# Patient Record
Sex: Female | Born: 1950 | Race: White | Hispanic: No | Marital: Married | Smoking: Never smoker
Health system: Southern US, Community
[De-identification: ages and names within clinical notes are randomized; demographics above are authoritative.]

## PROBLEM LIST (undated history)

## (undated) DIAGNOSIS — M316 Other giant cell arteritis: Secondary | ICD-10-CM

## (undated) DIAGNOSIS — K219 Gastro-esophageal reflux disease without esophagitis: Secondary | ICD-10-CM

## (undated) DIAGNOSIS — M353 Polymyalgia rheumatica: Secondary | ICD-10-CM

## (undated) DIAGNOSIS — I7300 Raynaud's syndrome without gangrene: Secondary | ICD-10-CM

## (undated) DIAGNOSIS — IMO0001 Reserved for inherently not codable concepts without codable children: Secondary | ICD-10-CM

## (undated) HISTORY — DX: Gastro-esophageal reflux disease without esophagitis: K21.9

## (undated) HISTORY — DX: Polymyalgia rheumatica: M35.3

## (undated) HISTORY — PX: TEMPORAL ARTERY BIOPSY / LIGATION: SUR132

## (undated) HISTORY — PX: LASIK: SHX215

## (undated) HISTORY — DX: Raynaud's syndrome without gangrene: I73.00

## (undated) HISTORY — DX: Other giant cell arteritis: M31.6

## (undated) HISTORY — DX: Reserved for inherently not codable concepts without codable children: IMO0001

---

## 2007-10-31 ENCOUNTER — Ambulatory Visit: Payer: Self-pay | Admitting: Internal Medicine

## 2008-09-27 ENCOUNTER — Ambulatory Visit: Payer: Self-pay | Admitting: Family Medicine

## 2010-02-22 ENCOUNTER — Ambulatory Visit: Payer: Self-pay

## 2012-01-31 ENCOUNTER — Ambulatory Visit: Payer: Self-pay

## 2012-04-04 DIAGNOSIS — I7300 Raynaud's syndrome without gangrene: Secondary | ICD-10-CM | POA: Insufficient documentation

## 2012-04-04 DIAGNOSIS — N951 Menopausal and female climacteric states: Secondary | ICD-10-CM | POA: Insufficient documentation

## 2013-05-17 DIAGNOSIS — M316 Other giant cell arteritis: Secondary | ICD-10-CM | POA: Insufficient documentation

## 2013-07-03 ENCOUNTER — Ambulatory Visit: Payer: Self-pay | Admitting: Hematology and Oncology

## 2013-07-29 ENCOUNTER — Ambulatory Visit: Payer: Self-pay | Admitting: Hematology and Oncology

## 2013-07-29 LAB — CBC CANCER CENTER
BASOS PCT: 0.6 %
Basophil #: 0.1 x10 3/mm (ref 0.0–0.1)
EOS ABS: 0.1 x10 3/mm (ref 0.0–0.7)
Eosinophil %: 0.9 %
HCT: 42.3 % (ref 35.0–47.0)
HGB: 13.7 g/dL (ref 12.0–16.0)
LYMPHS ABS: 1.1 x10 3/mm (ref 1.0–3.6)
Lymphocyte %: 9.5 %
MCH: 29.3 pg (ref 26.0–34.0)
MCHC: 32.5 g/dL (ref 32.0–36.0)
MCV: 90 fL (ref 80–100)
MONO ABS: 0.7 x10 3/mm (ref 0.2–0.9)
Monocyte %: 6.1 %
Neutrophil #: 9.8 x10 3/mm — ABNORMAL HIGH (ref 1.4–6.5)
Neutrophil %: 82.9 %
Platelet: 260 x10 3/mm (ref 150–440)
RBC: 4.69 10*6/uL (ref 3.80–5.20)
RDW: 14.6 % — ABNORMAL HIGH (ref 11.5–14.5)
WBC: 11.9 x10 3/mm — ABNORMAL HIGH (ref 3.6–11.0)

## 2013-08-11 ENCOUNTER — Ambulatory Visit: Payer: Self-pay | Admitting: Hematology and Oncology

## 2013-09-08 ENCOUNTER — Ambulatory Visit: Payer: Self-pay | Admitting: Hematology and Oncology

## 2013-10-09 ENCOUNTER — Ambulatory Visit: Payer: Self-pay | Admitting: Hematology and Oncology

## 2013-11-08 ENCOUNTER — Ambulatory Visit: Payer: Self-pay | Admitting: Hematology and Oncology

## 2013-11-25 ENCOUNTER — Ambulatory Visit: Payer: Self-pay | Admitting: Hematology and Oncology

## 2013-12-09 ENCOUNTER — Ambulatory Visit: Payer: Self-pay | Admitting: Hematology and Oncology

## 2014-01-08 ENCOUNTER — Ambulatory Visit: Payer: Self-pay | Admitting: Internal Medicine

## 2014-01-08 ENCOUNTER — Ambulatory Visit: Payer: Self-pay | Admitting: Hematology and Oncology

## 2014-02-08 ENCOUNTER — Ambulatory Visit: Payer: Self-pay | Admitting: Hematology and Oncology

## 2014-03-05 ENCOUNTER — Ambulatory Visit: Payer: Self-pay

## 2014-03-11 ENCOUNTER — Ambulatory Visit: Payer: Self-pay | Admitting: Hematology and Oncology

## 2014-03-11 DIAGNOSIS — Z79899 Other long term (current) drug therapy: Secondary | ICD-10-CM | POA: Diagnosis not present

## 2014-03-11 DIAGNOSIS — M316 Other giant cell arteritis: Secondary | ICD-10-CM | POA: Diagnosis not present

## 2014-04-10 ENCOUNTER — Ambulatory Visit: Payer: Self-pay | Admitting: Hematology and Oncology

## 2014-05-06 DIAGNOSIS — Z79899 Other long term (current) drug therapy: Secondary | ICD-10-CM | POA: Diagnosis not present

## 2014-05-06 DIAGNOSIS — M316 Other giant cell arteritis: Secondary | ICD-10-CM | POA: Diagnosis not present

## 2014-05-26 ENCOUNTER — Ambulatory Visit: Payer: Self-pay | Admitting: Hematology and Oncology

## 2014-05-26 DIAGNOSIS — M316 Other giant cell arteritis: Secondary | ICD-10-CM | POA: Diagnosis not present

## 2014-05-26 DIAGNOSIS — Z79899 Other long term (current) drug therapy: Secondary | ICD-10-CM | POA: Diagnosis not present

## 2014-06-10 ENCOUNTER — Ambulatory Visit: Payer: Self-pay | Admitting: Hematology and Oncology

## 2014-06-10 DIAGNOSIS — Z79899 Other long term (current) drug therapy: Secondary | ICD-10-CM | POA: Diagnosis not present

## 2014-06-10 DIAGNOSIS — M316 Other giant cell arteritis: Secondary | ICD-10-CM | POA: Diagnosis not present

## 2014-06-27 DIAGNOSIS — M353 Polymyalgia rheumatica: Secondary | ICD-10-CM | POA: Diagnosis not present

## 2014-07-11 ENCOUNTER — Ambulatory Visit: Payer: Self-pay | Admitting: Hematology and Oncology

## 2014-07-28 DIAGNOSIS — M353 Polymyalgia rheumatica: Secondary | ICD-10-CM | POA: Diagnosis not present

## 2014-08-08 DIAGNOSIS — M316 Other giant cell arteritis: Secondary | ICD-10-CM | POA: Diagnosis not present

## 2014-09-12 DIAGNOSIS — Z79899 Other long term (current) drug therapy: Secondary | ICD-10-CM | POA: Diagnosis not present

## 2014-09-12 DIAGNOSIS — M316 Other giant cell arteritis: Secondary | ICD-10-CM | POA: Diagnosis not present

## 2014-10-13 DIAGNOSIS — M316 Other giant cell arteritis: Secondary | ICD-10-CM | POA: Diagnosis not present

## 2014-10-16 DIAGNOSIS — M316 Other giant cell arteritis: Secondary | ICD-10-CM | POA: Diagnosis not present

## 2014-10-16 DIAGNOSIS — M353 Polymyalgia rheumatica: Secondary | ICD-10-CM | POA: Diagnosis not present

## 2014-11-17 DIAGNOSIS — Z79899 Other long term (current) drug therapy: Secondary | ICD-10-CM | POA: Diagnosis not present

## 2014-11-17 DIAGNOSIS — M316 Other giant cell arteritis: Secondary | ICD-10-CM | POA: Diagnosis not present

## 2014-11-20 DIAGNOSIS — M316 Other giant cell arteritis: Secondary | ICD-10-CM | POA: Diagnosis not present

## 2015-01-02 DIAGNOSIS — Z79899 Other long term (current) drug therapy: Secondary | ICD-10-CM | POA: Diagnosis not present

## 2015-01-02 DIAGNOSIS — M316 Other giant cell arteritis: Secondary | ICD-10-CM | POA: Diagnosis not present

## 2015-01-14 DIAGNOSIS — M315 Giant cell arteritis with polymyalgia rheumatica: Secondary | ICD-10-CM | POA: Diagnosis not present

## 2015-01-14 DIAGNOSIS — Z79899 Other long term (current) drug therapy: Secondary | ICD-10-CM | POA: Diagnosis not present

## 2015-01-19 DIAGNOSIS — M316 Other giant cell arteritis: Secondary | ICD-10-CM | POA: Diagnosis not present

## 2015-03-18 DIAGNOSIS — Z79899 Other long term (current) drug therapy: Secondary | ICD-10-CM | POA: Diagnosis not present

## 2015-03-18 DIAGNOSIS — M316 Other giant cell arteritis: Secondary | ICD-10-CM | POA: Diagnosis not present

## 2015-03-23 DIAGNOSIS — M316 Other giant cell arteritis: Secondary | ICD-10-CM | POA: Diagnosis not present

## 2015-05-12 DIAGNOSIS — Z79899 Other long term (current) drug therapy: Secondary | ICD-10-CM | POA: Diagnosis not present

## 2015-05-12 DIAGNOSIS — M316 Other giant cell arteritis: Secondary | ICD-10-CM | POA: Diagnosis not present

## 2015-05-18 DIAGNOSIS — M353 Polymyalgia rheumatica: Secondary | ICD-10-CM | POA: Diagnosis not present

## 2015-05-18 DIAGNOSIS — L508 Other urticaria: Secondary | ICD-10-CM | POA: Diagnosis not present

## 2015-06-29 DIAGNOSIS — L508 Other urticaria: Secondary | ICD-10-CM | POA: Diagnosis not present

## 2015-06-29 DIAGNOSIS — M353 Polymyalgia rheumatica: Secondary | ICD-10-CM | POA: Diagnosis not present

## 2015-06-30 DIAGNOSIS — M533 Sacrococcygeal disorders, not elsewhere classified: Secondary | ICD-10-CM | POA: Diagnosis not present

## 2015-06-30 DIAGNOSIS — Z79899 Other long term (current) drug therapy: Secondary | ICD-10-CM | POA: Diagnosis not present

## 2015-09-25 DIAGNOSIS — Z79899 Other long term (current) drug therapy: Secondary | ICD-10-CM | POA: Diagnosis not present

## 2015-09-30 DIAGNOSIS — L508 Other urticaria: Secondary | ICD-10-CM | POA: Diagnosis not present

## 2015-09-30 DIAGNOSIS — M353 Polymyalgia rheumatica: Secondary | ICD-10-CM | POA: Diagnosis not present

## 2015-10-14 DIAGNOSIS — Z79899 Other long term (current) drug therapy: Secondary | ICD-10-CM | POA: Diagnosis not present

## 2015-10-14 DIAGNOSIS — M353 Polymyalgia rheumatica: Secondary | ICD-10-CM | POA: Diagnosis not present

## 2015-10-26 DIAGNOSIS — M316 Other giant cell arteritis: Secondary | ICD-10-CM | POA: Diagnosis not present

## 2015-11-11 ENCOUNTER — Encounter: Payer: Self-pay | Admitting: Hematology and Oncology

## 2015-11-11 ENCOUNTER — Inpatient Hospital Stay: Payer: Medicare Other | Attending: Hematology and Oncology | Admitting: Hematology and Oncology

## 2015-11-11 ENCOUNTER — Inpatient Hospital Stay: Payer: Medicare Other

## 2015-11-11 VITALS — BP 124/86 | HR 88 | Temp 97.1°F | Resp 18 | Ht 59.5 in | Wt 212.3 lb

## 2015-11-11 DIAGNOSIS — M316 Other giant cell arteritis: Secondary | ICD-10-CM | POA: Diagnosis not present

## 2015-11-11 DIAGNOSIS — I7300 Raynaud's syndrome without gangrene: Secondary | ICD-10-CM | POA: Diagnosis not present

## 2015-11-11 DIAGNOSIS — Z7952 Long term (current) use of systemic steroids: Secondary | ICD-10-CM | POA: Diagnosis not present

## 2015-11-11 DIAGNOSIS — Z79899 Other long term (current) drug therapy: Secondary | ICD-10-CM | POA: Insufficient documentation

## 2015-11-11 DIAGNOSIS — R42 Dizziness and giddiness: Secondary | ICD-10-CM | POA: Insufficient documentation

## 2015-11-11 DIAGNOSIS — M353 Polymyalgia rheumatica: Secondary | ICD-10-CM | POA: Insufficient documentation

## 2015-11-11 DIAGNOSIS — K219 Gastro-esophageal reflux disease without esophagitis: Secondary | ICD-10-CM | POA: Diagnosis not present

## 2015-11-11 DIAGNOSIS — R5381 Other malaise: Secondary | ICD-10-CM | POA: Diagnosis not present

## 2015-11-11 MED ORDER — TOCILIZUMAB 400 MG/20ML IV SOLN
4.0000 mg/kg | INTRAVENOUS | Status: DC
  Administered 2015-11-11: 350 mg via INTRAVENOUS
  Filled 2015-11-11: qty 17.5

## 2015-11-11 MED ORDER — SODIUM CHLORIDE 0.9 % IV SOLN
Freq: Once | INTRAVENOUS | Status: AC
Start: 2015-11-11 — End: 2015-11-11
  Administered 2015-11-11: 13:00:00 via INTRAVENOUS
  Filled 2015-11-11: qty 1000

## 2015-11-11 NOTE — Progress Notes (Addendum)
[facility] day:  11/11/2015  Chief Complaint: Phyllis Warner is a 65 y.o. female with giant cell arteritis who is referred for by Dr. Ernestene Mention for continuation of tocilizumab (Actemra).  HPI:  The patient has a history of Raynaud's disease and polymyalgia rheumatica. She states that she was diagnosed with giant cell arteritis in 11/2012. She underwent temporal artery biopsy.  Notes indicate that she has a history of  SSA, ccp, elevated CRP and ESR associated with her giant cell arteritis. She has been treated with prednisone and Imuran. Imuran was discontinued after 3 weeks secondary to significant side effects.  She has been treated with Actemra in the past and (07/2013.  She states that she tolerated her infusions well.  Labs on 10/14/2015 revealed a hematocrit of 42.1, hemoglobin 14.0, MCV 87, platelets 241,000, WBC 9700 with an ANC of 7400.   CRP was 8.9 (high).  Symptomatically, she has felt bad for 2 weeks.  She describes being dizzy.  She denies any pain.  Past Medical History  Diagnosis Date  . Giant cell arteritis ([place])   . Raynaud's disease   . Polymyalgia rheumatica ([place])   . Reflux     Past Surgical History  Procedure Laterality Date  . Temporal artery biopsy / ligation    . Lasik      Family History  Problem Relation Age of Onset  . Cancer Mother     ovarian    Social History:  reports that she has never smoked. She has never used smokeless tobacco. She reports that she does not drink alcohol or use illicit drugs.  The patient is alone today.  Allergies:  Allergies  Allergen Reactions  . Hydroxyzine Swelling  . Valacyclovir Hives  . Albuterol Other (See Comments)    Repetabs; made her feel "tight" in neck and chest.............INHALER OK to use  . Aspirin Swelling  . Clarithromycin Other (See Comments)     turned tooth black  . Erythromycin Nausea And Vomiting  . Ketorolac Tromethamine     Other  reaction(s): Other (See Comments) headache  . Penicillins Other (See Comments)    Questionable - listed on previous provider records but no reaction description  . Sulfa Antibiotics Other (See Comments)    Blue hands  . Codeine Nausea And Vomiting    Allergy to codeine/codeine derivatives  . Doxycycline Hyclate Diarrhea and Nausea Only    Current Medications: Current Outpatient Prescriptions  Medication Sig Dispense Refill  . Cholecalciferol (VITAMIN D3) 2000 units capsule Take by mouth.    . CVS HEARTBURN RELIEF 200 MG tablet Take 200 mg by mouth 2 (two) times daily.  11  . folic acid (FOLVITE) 1 MG tablet     . hydrOXYzine (ATARAX/VISTARIL) 25 MG tablet TAKE 1 TABLET(S) 3 TIMES A DAY BY ORAL ROUTE.  2  . meloxicam (MOBIC) 15 MG tablet TAKE 1 TABLET(S) EVERY DAY BY ORAL ROUTE FOR 30 DAYS.  6  . norethindrone-ethinyl estradiol (FEMHRT LOW DOSE) 0.5-2.5 MG-MCG tablet     . predniSONE (DELTASONE) 5 MG tablet      Current Facility-Administered Medications  Medication Dose Route Frequency Provider Last Rate Last Dose  . tocilizumab (ACTEMRA) 4 mg/kg = 350 mg in sodium chloride 0.9 % 100 mL infusion  4 mg/kg (Order-Specific) Intravenous Q28 days Lequita Asal, MD   Stopped at 11/11/15 1340    Review of Systems:  GENERAL:  Feels bad.  No fevers, sweats or weight  loss. PERFORMANCE STATUS (ECOG):  1 HEENT:  No visual changes, runny nose, sore throat, mouth sores or tenderness. Lungs: No shortness of breath or cough.  No hemoptysis. Cardiac:  No chest pain, palpitations, orthopnea, or PND. GI:  No nausea, vomiting, diarrhea, constipation, melena or hematochezia. GU:  No urgency, frequency, dysuria, or hematuria. Musculoskeletal:  No back pain.  No joint pain.  No muscle tenderness. Extremities:  No pain or swelling. Skin:  h/o urticaria.  No rashes or skin changes. Neuro:  Dizzy at times.  No headache, numbness or weakness, balance or coordination issues. Endocrine:  No diabetes,  thyroid issues, hot flashes or night sweats. Psych:  No mood changes, depression or anxiety. Pain:  No focal pain. Review of systems:  All other systems reviewed and found to be negative.  Physical Exam: Blood pressure 124/86, pulse 88, temperature 97.1 F (36.2 C), temperature source Tympanic, resp. rate 18, height 4' 11.5" (1.511 m), weight 212 lb 4.9 oz (96.3 kg). GENERAL:  Well developed, well nourished, sitting comfortably in the exam room in no acute distress. MENTAL STATUS:  Alert and oriented to person, place and time. HEAD:  Short styled dark hair with highlights.  Normocephalic, atraumatic, face full and symmetric, no Cushingoid features. EYES:  Blue eyes.  Pupils equal round and reactive to light and accomodation.  No conjunctivitis or scleral icterus. ENT:  Oropharynx clear without lesion.  Tongue normal. Mucous membranes moist.  RESPIRATORY:  Clear to auscultation without rales, wheezes or rhonchi. CARDIOVASCULAR:  Regular rate and rhythm without murmur, rub or gallop. ABDOMEN:  Soft, non-tender, with active bowel sounds, and no hepatosplenomegaly.  No masses. SKIN:  No rashes, ulcers or lesions. EXTREMITIES: No edema, no skin discoloration or tenderness.  No palpable cords. LYMPH NODES: No palpable cervical, supraclavicular, axillary or inguinal adenopathy  NEUROLOGICAL: Unremarkable. PSYCH:  Appropriate.  No visits with results within 3 Day(s) from this visit. Latest known visit with results is:  [facility]version on 07/29/2013  Component Date Value Ref Range Status  . WBC 07/29/2013 11.9* 3.6-11.0 x10 3/mm  Final  . RBC 07/29/2013 4.69  3.80-5.20 x10 6/mm  Final  . HGB 07/29/2013 13.7  12.0-16.0 g/dL Final  . HCT 07/29/2013 42.3  35.0-47.0 % Final  . MCV 07/29/2013 90  80-100 fL Final  . MCH 07/29/2013 29.3  26.0-34.0 pg Final  . MCHC 07/29/2013 32.5  32.0-36.0 g/dL Final  . RDW 07/29/2013 14.6* 11.5-14.5 % Final  . Platelet 07/29/2013 260  150-440 x10 3/mm  Final  .  Neutrophil % 07/29/2013 82.9   Final  . Lymphocyte % 07/29/2013 9.5   Final  . Monocyte % 07/29/2013 6.1   Final  . Eosinophil % 07/29/2013 0.9   Final  . Basophil % 07/29/2013 0.6   Final  . Neutrophil # 07/29/2013 9.8* 1.4-6.5 x10 3/mm  Final  . Lymphocyte # 07/29/2013 1.1  1.0-3.6 x10 3/mm  Final  . Monocyte # 07/29/2013 0.7  0.2-0.9 x10 3/mm  Final  . Eosinophil # 07/29/2013 0.1  0.0-0.7 x10 3/mm  Final  . Basophil # 07/29/2013 0.1  0.0-0.1 x10 3/mm  Final   Comment: Labs - This specimen was collected through an   - indwelling catheter or arterial line.  - A minimum of 59ms of blood was wasted prior    - to collecting the sample.  Interpret  - results with caution.     Assessment:  PIngrid Shifrinis a 65y.o. female with giant cell arteritis diagnosed  in 11/2012.  She has been treated with prednisone and Imuran. She has been treated with Actemra in the past (07/2013).  She tolerated her infusions well.  Labs on 10/14/2015 revealed a hematocrit of 42.1, hemoglobin 14.0, MCV 87, platelets 241,000, WBC 9700 with an ANC of 7400.   CRP was 8.9 (high).  Symptomatically, she has felt bad for 2 weeks.  She has been dizzy.  She denies any pain.  Plan: 1.  Review diagnosis and management of giant cell arteritis.  Discuss the use of tocilizumab.  Discuss issues with infections.  Patient has been tested for TB (negative). Discuss follow-up labs prior to treatments.  Patient states that she gets regular labs with her physician who is directing her therapy. 2.  Tocilizumab (Actemra) 350 mg IV today. 3.  RTC monthly for Actemta. 4.  RTC in 6 months for MD assess, review of outside labs, and Actemra.    Lequita Asal, MD  11/11/2015

## 2015-11-11 NOTE — Progress Notes (Signed)
Pt reports she has Giant Cell Arteritis.  No other recent concerns Dr. Eldridge Abrahams in rheumatology

## 2015-11-13 ENCOUNTER — Telehealth: Payer: Self-pay | Admitting: Hematology and Oncology

## 2015-11-13 NOTE — Telephone Encounter (Signed)
  Please find out what she would like.  Thanks,  M

## 2015-11-13 NOTE — Telephone Encounter (Signed)
Dr. Cristino Martes would like to have labs drawn when she has infusions. Please call Marcelline Mates to discuss: [phone]

## 2015-11-17 NOTE — Telephone Encounter (Signed)
Got a call back from [place] office and they want pt to have labs done every 2 months in our office.  I told the staff there inher office that I will have to ask pt to have them in [place] or with [facility] because at dr Cristino Martes office she gets them at [place]

## 2015-11-18 NOTE — Telephone Encounter (Signed)
  Do we have a list of what they want?  I assume they are to be timed so results are back before her infusions.  M

## 2015-11-25 ENCOUNTER — Telehealth: Payer: Self-pay | Admitting: *Deleted

## 2015-11-25 NOTE — Telephone Encounter (Signed)
Called pt after speaking to [place] about her insurance and she should be able to go anywhere and Dr. Bonnell Public office had told us that pt should get labs every 2 months.  I let pt know that based on instructions given to me for lab work that she can come here and the next lab would need to be drawn on her June visit. She is agreeable to this.  I also told her the next lab drawn would be August and that I did speak to Dr. Mike Gip and to Dr. Rogue Bussing and he is willing to accept her as a patient and I tried to explain the reason why [facility] asked her so many questions trying to get to know her and checking her chart from gitt[place] to make sure she is up to date on the info in it but she stopped me a couple of times and said that is not the way she felt.  She had to wait a long time and she acted to pt as if she did not know anything about her and called her a new pt was her impression.  I tried to tell her that she was a new pt to md but and old patient to clinic.  I have changed her appt  To see Dr. Jacinto Reap to a tues in October from a wed on [facility]. Because dr B is only there in [place] on tues. She was agreeable to all changes and agreed to come in for labs 9 am and this would allow metabolic panel to get back at the time of her infusion appt and she was ok with that. I and Mickel Baas edited her appts and make them to fit the above conversation and Mickel Baas is mailing her a new schedule as I told her we would do and she was grateful for the help.

## 2015-11-26 ENCOUNTER — Other Ambulatory Visit: Payer: Self-pay | Admitting: *Deleted

## 2015-11-26 DIAGNOSIS — M316 Other giant cell arteritis: Secondary | ICD-10-CM

## 2015-11-26 NOTE — Telephone Encounter (Signed)
The staff at Dr. Bonnell Public office said cbc, met c. I have entered them for all lab draws.

## 2015-12-06 ENCOUNTER — Encounter: Payer: Self-pay | Admitting: Hematology and Oncology

## 2015-12-09 ENCOUNTER — Inpatient Hospital Stay: Payer: Medicare Other

## 2015-12-09 DIAGNOSIS — M316 Other giant cell arteritis: Secondary | ICD-10-CM | POA: Diagnosis not present

## 2015-12-16 ENCOUNTER — Other Ambulatory Visit: Payer: Self-pay | Admitting: Hematology and Oncology

## 2015-12-16 ENCOUNTER — Inpatient Hospital Stay: Payer: Medicare Other | Attending: Internal Medicine

## 2015-12-16 ENCOUNTER — Inpatient Hospital Stay: Payer: Medicare Other

## 2015-12-16 VITALS — BP 109/73 | HR 68 | Temp 96.1°F | Resp 18

## 2015-12-16 DIAGNOSIS — M316 Other giant cell arteritis: Secondary | ICD-10-CM

## 2015-12-16 DIAGNOSIS — Z79899 Other long term (current) drug therapy: Secondary | ICD-10-CM | POA: Insufficient documentation

## 2015-12-16 LAB — COMPREHENSIVE METABOLIC PANEL
ALT: 18 U/L (ref 14–54)
AST: 16 U/L (ref 15–41)
Albumin: 3.8 g/dL (ref 3.5–5.0)
Alkaline Phosphatase: 61 U/L (ref 38–126)
Anion gap: 8 (ref 5–15)
BUN: 21 mg/dL — ABNORMAL HIGH (ref 6–20)
CO2: 24 mmol/L (ref 22–32)
Calcium: 8.8 mg/dL — ABNORMAL LOW (ref 8.9–10.3)
Chloride: 108 mmol/L (ref 101–111)
Creatinine, Ser: 0.85 mg/dL (ref 0.44–1.00)
GFR calc Af Amer: >60 mL/min (ref >60)
GFR calc non Af Amer: >60 mL/min (ref >60)
Glucose, Bld: 126 mg/dL — ABNORMAL HIGH (ref 65–99)
Potassium: 3.7 mmol/L (ref 3.5–5.1)
Sodium: 140 mmol/L (ref 135–145)
Total Bilirubin: 0.7 mg/dL (ref 0.3–1.2)
Total Protein: 6.6 g/dL (ref 6.5–8.1)

## 2015-12-16 LAB — CBC WITH DIFFERENTIAL/PLATELET
Basophils Absolute: 0 10*3/uL (ref 0–0.1)
Basophils Relative: 0 %
Eosinophils Absolute: 0.4 10*3/uL (ref 0–0.7)
Eosinophils Relative: 6 %
HCT: 41.9 % (ref 35.0–47.0)
Hemoglobin: 13.8 g/dL (ref 12.0–16.0)
Lymphocytes Relative: 21 %
Lymphs Abs: 1.5 10*3/uL (ref 1.0–3.6)
MCH: 28.7 pg (ref 26.0–34.0)
MCHC: 32.9 g/dL (ref 32.0–36.0)
MCV: 87.1 fL (ref 80.0–100.0)
Monocytes Absolute: 0.7 10*3/uL (ref 0.2–0.9)
Monocytes Relative: 9 %
Neutro Abs: 4.5 10*3/uL (ref 1.4–6.5)
Neutrophils Relative %: 64 %
Platelets: 197 10*3/uL (ref 150–440)
RBC: 4.81 MIL/uL (ref 3.80–5.20)
RDW: 15.4 % — ABNORMAL HIGH (ref 11.5–14.5)
WBC: 7.1 10*3/uL (ref 3.6–11.0)

## 2015-12-16 MED ORDER — SODIUM CHLORIDE 0.9 % IV SOLN
360.0000 mg | Freq: Once | INTRAVENOUS | Status: AC
  Administered 2015-12-16: 360 mg via INTRAVENOUS
  Filled 2015-12-16: qty 18

## 2015-12-16 MED ORDER — TOCILIZUMAB 400 MG/20ML IV SOLN
360.0000 mg | Freq: Once | INTRAVENOUS | Status: DC
  Filled 2015-12-16: qty 18

## 2015-12-16 MED ORDER — SODIUM CHLORIDE 0.9 % IV SOLN
Freq: Once | INTRAVENOUS | Status: AC
  Administered 2015-12-16: 09:00:00 via INTRAVENOUS
  Filled 2015-12-16: qty 1000

## 2016-01-06 ENCOUNTER — Inpatient Hospital Stay: Payer: Medicare Other

## 2016-01-13 ENCOUNTER — Other Ambulatory Visit: Payer: Self-pay | Admitting: Hematology and Oncology

## 2016-01-13 ENCOUNTER — Inpatient Hospital Stay: Payer: Medicare Other | Attending: Hematology and Oncology

## 2016-01-13 VITALS — BP 117/80 | HR 71 | Temp 98.0°F | Resp 18

## 2016-01-13 DIAGNOSIS — M316 Other giant cell arteritis: Secondary | ICD-10-CM | POA: Diagnosis not present

## 2016-01-13 DIAGNOSIS — Z8249 Family history of ischemic heart disease and other diseases of the circulatory system: Secondary | ICD-10-CM

## 2016-01-13 DIAGNOSIS — Z8269 Family history of other diseases of the musculoskeletal system and connective tissue: Secondary | ICD-10-CM

## 2016-01-13 MED ORDER — TOCILIZUMAB 400 MG/20ML IV SOLN: 3.6300 mg/kg | Freq: Once | INTRAVENOUS | Status: DC

## 2016-01-13 MED ORDER — TOCILIZUMAB 400 MG/20ML IV SOLN
360.0000 mg | INTRAVENOUS | Status: DC
  Administered 2016-01-13: 360 mg via INTRAVENOUS
  Filled 2016-01-13: qty 18

## 2016-02-03 ENCOUNTER — Inpatient Hospital Stay: Payer: Medicare Other

## 2016-02-10 ENCOUNTER — Other Ambulatory Visit: Payer: Medicare Other

## 2016-02-10 ENCOUNTER — Inpatient Hospital Stay: Payer: Medicare Other

## 2016-02-17 ENCOUNTER — Other Ambulatory Visit: Payer: Self-pay | Admitting: Hematology and Oncology

## 2016-02-17 ENCOUNTER — Inpatient Hospital Stay: Payer: Medicare Other

## 2016-02-17 ENCOUNTER — Inpatient Hospital Stay: Payer: Medicare Other | Attending: Hematology and Oncology

## 2016-02-17 VITALS — BP 110/73 | HR 81 | Temp 95.6°F | Resp 18

## 2016-02-17 DIAGNOSIS — M316 Other giant cell arteritis: Secondary | ICD-10-CM | POA: Insufficient documentation

## 2016-02-17 DIAGNOSIS — Z79899 Other long term (current) drug therapy: Secondary | ICD-10-CM | POA: Insufficient documentation

## 2016-02-17 LAB — CBC WITH DIFFERENTIAL/PLATELET
Basophils Absolute: 0 10*3/uL (ref 0–0.1)
Basophils Relative: 0 %
Eosinophils Absolute: 0.3 10*3/uL (ref 0–0.7)
Eosinophils Relative: 5 %
HCT: 42.9 % (ref 35.0–47.0)
Hemoglobin: 14.3 g/dL (ref 12.0–16.0)
Lymphocytes Relative: 20 %
Lymphs Abs: 1.3 10*3/uL (ref 1.0–3.6)
MCH: 29.3 pg (ref 26.0–34.0)
MCHC: 33.2 g/dL (ref 32.0–36.0)
MCV: 88.4 fL (ref 80.0–100.0)
Monocytes Absolute: 0.6 10*3/uL (ref 0.2–0.9)
Monocytes Relative: 9 %
Neutro Abs: 4.2 10*3/uL (ref 1.4–6.5)
Neutrophils Relative %: 66 %
Platelets: 182 10*3/uL (ref 150–440)
RBC: 4.86 MIL/uL (ref 3.80–5.20)
RDW: 13.9 % (ref 11.5–14.5)
WBC: 6.4 10*3/uL (ref 3.6–11.0)

## 2016-02-17 LAB — COMPREHENSIVE METABOLIC PANEL
ALT: 20 U/L (ref 14–54)
AST: 17 U/L (ref 15–41)
Albumin: 4 g/dL (ref 3.5–5.0)
Alkaline Phosphatase: 48 U/L (ref 38–126)
Anion gap: 7 (ref 5–15)
BUN: 16 mg/dL (ref 6–20)
CO2: 27 mmol/L (ref 22–32)
Calcium: 8.8 mg/dL — ABNORMAL LOW (ref 8.9–10.3)
Chloride: 107 mmol/L (ref 101–111)
Creatinine, Ser: 0.94 mg/dL (ref 0.44–1.00)
GFR calc Af Amer: >60 mL/min (ref >60)
GFR calc non Af Amer: >60 mL/min (ref >60)
Glucose, Bld: 124 mg/dL — ABNORMAL HIGH (ref 65–99)
Potassium: 3.5 mmol/L (ref 3.5–5.1)
Sodium: 141 mmol/L (ref 135–145)
Total Bilirubin: 0.6 mg/dL (ref 0.3–1.2)
Total Protein: 6.6 g/dL (ref 6.5–8.1)

## 2016-02-17 MED ORDER — TOCILIZUMAB 400 MG/20ML IV SOLN
360.0000 mg | Freq: Once | INTRAVENOUS | Status: AC
  Administered 2016-02-17: 360 mg via INTRAVENOUS
  Filled 2016-02-17: qty 18

## 2016-02-17 MED ORDER — TOCILIZUMAB 400 MG/20ML IV SOLN
360.0000 mg | Freq: Once | INTRAVENOUS | Status: DC
  Filled 2016-02-17: qty 18

## 2016-02-17 MED ORDER — SODIUM CHLORIDE 0.9 % IV SOLN
Freq: Once | INTRAVENOUS | Status: AC
  Administered 2016-02-17: 10:00:00 via INTRAVENOUS
  Filled 2016-02-17: qty 1000

## 2016-02-26 DIAGNOSIS — M316 Other giant cell arteritis: Secondary | ICD-10-CM | POA: Diagnosis not present

## 2016-03-01 DIAGNOSIS — E2839 Other primary ovarian failure: Secondary | ICD-10-CM | POA: Diagnosis not present

## 2016-03-02 ENCOUNTER — Inpatient Hospital Stay: Payer: Medicare Other

## 2016-03-16 ENCOUNTER — Inpatient Hospital Stay: Payer: Medicare Other | Attending: Hematology and Oncology

## 2016-03-16 ENCOUNTER — Telehealth: Payer: Self-pay | Admitting: Pharmacist

## 2016-03-16 ENCOUNTER — Other Ambulatory Visit: Payer: Self-pay | Admitting: Hematology and Oncology

## 2016-03-16 VITALS — BP 106/68 | HR 78 | Temp 98.1°F | Resp 18

## 2016-03-16 DIAGNOSIS — Z79899 Other long term (current) drug therapy: Secondary | ICD-10-CM | POA: Diagnosis not present

## 2016-03-16 DIAGNOSIS — M316 Other giant cell arteritis: Secondary | ICD-10-CM | POA: Diagnosis not present

## 2016-03-16 MED ORDER — SODIUM CHLORIDE 0.9 % IV SOLN
360.0000 mg | Freq: Once | INTRAVENOUS | Status: AC
  Administered 2016-03-16: 360 mg via INTRAVENOUS
  Filled 2016-03-16: qty 18

## 2016-03-16 MED ORDER — SODIUM CHLORIDE 0.9 % IV SOLN
360.0000 mg | Freq: Once | INTRAVENOUS | Status: AC
  Filled 2016-03-16: qty 18

## 2016-03-16 MED ORDER — TOCILIZUMAB 80 MG/4ML IV SOLN
Freq: Once | INTRAVENOUS | Status: AC
  Filled 2016-03-16: qty 82

## 2016-03-16 NOTE — Telephone Encounter (Signed)
Per Jacqualin Combes, patient has been authorized for Actemra

## 2016-03-30 ENCOUNTER — Inpatient Hospital Stay: Payer: Medicare Other

## 2016-03-30 DIAGNOSIS — M316 Other giant cell arteritis: Secondary | ICD-10-CM | POA: Diagnosis not present

## 2016-03-30 DIAGNOSIS — Z79899 Other long term (current) drug therapy: Secondary | ICD-10-CM | POA: Diagnosis not present

## 2016-04-06 ENCOUNTER — Inpatient Hospital Stay: Payer: Medicare Other

## 2016-04-15 ENCOUNTER — Other Ambulatory Visit: Payer: Self-pay

## 2016-04-15 DIAGNOSIS — M316 Other giant cell arteritis: Secondary | ICD-10-CM

## 2016-04-19 ENCOUNTER — Encounter: Payer: Self-pay | Admitting: Internal Medicine

## 2016-04-19 ENCOUNTER — Inpatient Hospital Stay: Payer: Medicare Other | Attending: Hematology and Oncology

## 2016-04-19 ENCOUNTER — Inpatient Hospital Stay: Payer: Medicare Other

## 2016-04-19 ENCOUNTER — Inpatient Hospital Stay (HOSPITAL_BASED_OUTPATIENT_CLINIC_OR_DEPARTMENT_OTHER): Payer: Medicare Other | Admitting: Internal Medicine

## 2016-04-19 VITALS — BP 100/70 | HR 90 | Temp 96.7°F | Resp 18 | Ht 59.5 in | Wt 214.7 lb

## 2016-04-19 DIAGNOSIS — I7300 Raynaud's syndrome without gangrene: Secondary | ICD-10-CM | POA: Insufficient documentation

## 2016-04-19 DIAGNOSIS — M315 Giant cell arteritis with polymyalgia rheumatica: Secondary | ICD-10-CM

## 2016-04-19 DIAGNOSIS — Z79899 Other long term (current) drug therapy: Secondary | ICD-10-CM | POA: Insufficient documentation

## 2016-04-19 DIAGNOSIS — M316 Other giant cell arteritis: Secondary | ICD-10-CM

## 2016-04-19 DIAGNOSIS — Z7952 Long term (current) use of systemic steroids: Secondary | ICD-10-CM | POA: Diagnosis not present

## 2016-04-19 DIAGNOSIS — K219 Gastro-esophageal reflux disease without esophagitis: Secondary | ICD-10-CM

## 2016-04-19 LAB — COMPREHENSIVE METABOLIC PANEL
ALBUMIN: 4.6 g/dL (ref 3.5–5.0)
ALK PHOS: 58 U/L (ref 38–126)
ALT: 22 U/L (ref 14–54)
ANION GAP: 8 (ref 5–15)
AST: 22 U/L (ref 15–41)
BILIRUBIN TOTAL: 0.6 mg/dL (ref 0.3–1.2)
BUN: 19 mg/dL (ref 6–20)
CALCIUM: 9 mg/dL (ref 8.9–10.3)
CO2: 23 mmol/L (ref 22–32)
CREATININE: 0.93 mg/dL (ref 0.44–1.00)
Chloride: 109 mmol/L (ref 101–111)
GFR calc Af Amer: >60 mL/min (ref >60)
GFR calc non Af Amer: >60 mL/min (ref >60)
GLUCOSE: 166 mg/dL — AB (ref 65–99)
Potassium: 4 mmol/L (ref 3.5–5.1)
SODIUM: 140 mmol/L (ref 135–145)
TOTAL PROTEIN: 7 g/dL (ref 6.5–8.1)

## 2016-04-19 LAB — CBC WITH DIFFERENTIAL/PLATELET
BASOS ABS: 0 10*3/uL (ref 0–0.1)
Basophils Relative: 1 %
EOS PCT: 2 %
Eosinophils Absolute: 0.1 10*3/uL (ref 0–0.7)
HCT: 41.7 % (ref 35.0–47.0)
Hemoglobin: 14 g/dL (ref 12.0–16.0)
LYMPHS PCT: 11 %
Lymphs Abs: 0.9 10*3/uL — ABNORMAL LOW (ref 1.0–3.6)
MCH: 30 pg (ref 26.0–34.0)
MCHC: 33.6 g/dL (ref 32.0–36.0)
MCV: 89.2 fL (ref 80.0–100.0)
MONO ABS: 0.3 10*3/uL (ref 0.2–0.9)
Monocytes Relative: 4 %
Neutro Abs: 7 10*3/uL — ABNORMAL HIGH (ref 1.4–6.5)
Neutrophils Relative %: 82 %
Platelets: 178 10*3/uL (ref 150–440)
RBC: 4.67 MIL/uL (ref 3.80–5.20)
RDW: 13.7 % (ref 11.5–14.5)
WBC: 8.4 10*3/uL (ref 3.6–11.0)

## 2016-04-19 MED ORDER — TOCILIZUMAB 400 MG/20ML IV SOLN
360.0000 mg | Freq: Once | INTRAVENOUS | Status: AC
  Administered 2016-04-19: 360 mg via INTRAVENOUS
  Filled 2016-04-19: qty 18

## 2016-04-19 NOTE — Progress Notes (Signed)
No concerns no changes 

## 2016-04-19 NOTE — Assessment & Plan Note (Signed)
Temporal Arteritis- [Dr.Klett; Rheumatology] since May 2017 q monthly. Significant clinical response as per pt on Actemra every 28 days likely for 1 year [? Until may 2018]. No significant side effects noted. Labs within normal limits.  # [facility]/ Dr.Klett, nicole.   # follow up in 6 months/ labs- if still on treatment.

## 2016-04-19 NOTE — Progress Notes (Signed)
[facility] OFFICE PROGRESS NOTE  No care team member to display  No matching staging information was found for the patient.    No history exists.      INTERVAL HISTORY:  Phyllis Warner 65 y.o.  female pleasant patient Giant cell arteritis/ multiple other rheumatologic disorder-currently in the care of rheumatology- presents here to get her monthly Actemra.  Patient states her symptoms from her giant cell arteritis which are mostly visual have significantly improved since being on the treatment. Denies any new onset of shortness of breath or cough. No skin rash.  REVIEW OF SYSTEMS:  A complete 10 point review of system is done which is negative except mentioned above/history of present illness.   PAST MEDICAL HISTORY :  Past Medical History:  Diagnosis Date  . Giant cell arteritis ([place])   . Polymyalgia rheumatica ([place])   . Raynaud's disease   . Reflux     PAST SURGICAL HISTORY :   Past Surgical History:  Procedure Laterality Date  . LASIK    . TEMPORAL ARTERY BIOPSY / LIGATION      FAMILY HISTORY :   Family History  Problem Relation Age of Onset  . Cancer Mother     ovarian    SOCIAL HISTORY:   Social History  Substance Use Topics  . Smoking status: Never Smoker  . Smokeless tobacco: Never Used  . Alcohol use No    ALLERGIES:  is allergic to hydroxyzine; valacyclovir; albuterol; aspirin; clarithromycin; erythromycin; ketorolac tromethamine; penicillins; sulfa antibiotics; codeine; and doxycycline hyclate.  MEDICATIONS:  Current Outpatient Prescriptions  Medication Sig Dispense Refill  . cetirizine (ZYRTEC) 10 MG tablet Take by mouth.    . Cholecalciferol (VITAMIN D3) 2000 units capsule Take by mouth.    . CVS HEARTBURN RELIEF 200 MG tablet Take 200 mg by mouth 2 (two) times daily.  11  . folic acid (FOLVITE) 1 MG tablet     . hydrOXYzine (ATARAX/VISTARIL) 25 MG tablet TAKE 1 TABLET(S) 3 TIMES A DAY BY ORAL ROUTE.  2  . meloxicam (MOBIC)  15 MG tablet TAKE 1 TABLET(S) EVERY DAY BY ORAL ROUTE FOR 30 DAYS.  6  . norethindrone-ethinyl estradiol (FEMHRT LOW DOSE) 0.5-2.5 MG-MCG tablet     . predniSONE (DELTASONE) 5 MG tablet      Current Facility-Administered Medications  Medication Dose Route Frequency Provider Last Rate Last Dose  . tocilizumab (ACTEMRA) 360 mg in sodium chloride 0.9 % 100 mL infusion  360 mg Intravenous Once Lequita Asal, MD      . Tocilizumab (ACTEMRA) in sodium chloride 0.9 % 100 mL infusion   Intravenous Once Lequita Asal, MD        PHYSICAL EXAMINATION: ECOG PERFORMANCE STATUS: 0 - Asymptomatic  BP 100/70 (BP Location: Left Arm, Patient Position: Sitting)   Pulse 90   Temp (!) 96.7 F (35.9 C) (Tympanic)   Resp 18   Ht 4' 11.5" (1.511 m)   Wt 214 lb 11.7 oz (97.4 kg)   BMI 42.64 kg/m   Filed Weights   04/19/16 1333  Weight: 214 lb 11.7 oz (97.4 kg)    GENERAL: Well-nourished well-developed; Alert, no distress and comfortable.   Alone.  EYES: no pallor or icterus OROPHARYNX: no thrush or ulceration; good dentition  NECK: supple, no masses felt LYMPH:  no palpable lymphadenopathy in the cervical, axillary or inguinal regions LUNGS: clear to auscultation and  No wheeze or crackles HEART/CVS: regular rate & rhythm and no murmurs;  No lower extremity edema ABDOMEN:abdomen soft, non-tender and normal bowel sounds Musculoskeletal:no cyanosis of digits and no clubbing  PSYCH: alert & oriented x 3 with fluent speech NEURO: no focal motor/sensory deficits SKIN:  no rashes or significant lesions  LABORATORY DATA:  I have reviewed the data as listed    Component Value Date/Time   NA 140 04/19/2016 1317   K 4.0 04/19/2016 1317   CL 109 04/19/2016 1317   CO2 23 04/19/2016 1317   GLUCOSE 166 (H) 04/19/2016 1317   BUN 19 04/19/2016 1317   CREATININE 0.93 04/19/2016 1317   CALCIUM 9.0 04/19/2016 1317   PROT 7.0 04/19/2016 1317   ALBUMIN 4.6 04/19/2016 1317   AST 22 04/19/2016 1317    ALT 22 04/19/2016 1317   ALKPHOS 58 04/19/2016 1317   BILITOT 0.6 04/19/2016 1317   GFRNONAA >60 04/19/2016 1317   GFRAA >60 04/19/2016 1317    No results found for: SPEP, UPEP  Lab Results  Component Value Date   WBC 8.4 04/19/2016   NEUTROABS 7.0 (H) 04/19/2016   HGB 14.0 04/19/2016   HCT 41.7 04/19/2016   MCV 89.2 04/19/2016   PLT 178 04/19/2016      Chemistry      Component Value Date/Time   NA 140 04/19/2016 1317   K 4.0 04/19/2016 1317   CL 109 04/19/2016 1317   CO2 23 04/19/2016 1317   BUN 19 04/19/2016 1317   CREATININE 0.93 04/19/2016 1317      Component Value Date/Time   CALCIUM 9.0 04/19/2016 1317   ALKPHOS 58 04/19/2016 1317   AST 22 04/19/2016 1317   ALT 22 04/19/2016 1317   BILITOT 0.6 04/19/2016 1317       RADIOGRAPHIC STUDIES: I have personally reviewed the radiological images as listed and agreed with the findings in the report. No results found.   ASSESSMENT & PLAN:  Temporal arteritis ([place]) Temporal Arteritis- [Dr.Klett; Rheumatology] since May 2017 q monthly. Significant clinical response as per pt on Actemra every 28 days likely for 1 year [? Until may 2018]. No significant side effects noted. Labs within normal limits.  # [facility]/ Dr.Klett, nicole.   # follow up in 6 months/ labs- if still on treatment.    Orders Placed This Encounter  Procedures  . CBC with Differential    Standing Status:   Future    Standing Expiration Date:   04/19/2017  . Comprehensive metabolic panel    Standing Status:   Future    Standing Expiration Date:   04/19/2017   All questions were answered. The patient knows to call the clinic with any problems, questions or concerns.      Cammie Sickle, MD 04/19/2016 4:56 PM

## 2016-04-27 ENCOUNTER — Inpatient Hospital Stay: Payer: Medicare Other

## 2016-05-03 ENCOUNTER — Inpatient Hospital Stay: Payer: Medicare Other

## 2016-05-03 ENCOUNTER — Ambulatory Visit: Payer: Medicare Other | Admitting: Internal Medicine

## 2016-05-03 ENCOUNTER — Other Ambulatory Visit: Payer: Medicare Other

## 2016-05-04 ENCOUNTER — Ambulatory Visit: Payer: Medicare Other | Admitting: Hematology and Oncology

## 2016-05-04 ENCOUNTER — Inpatient Hospital Stay: Payer: Medicare Other

## 2016-05-12 DIAGNOSIS — M353 Polymyalgia rheumatica: Secondary | ICD-10-CM | POA: Diagnosis not present

## 2016-05-12 DIAGNOSIS — M316 Other giant cell arteritis: Secondary | ICD-10-CM | POA: Diagnosis not present

## 2016-05-17 ENCOUNTER — Inpatient Hospital Stay: Payer: Medicare Other | Attending: Hematology and Oncology

## 2016-05-17 VITALS — BP 118/77 | HR 69 | Temp 96.9°F | Resp 18

## 2016-05-17 DIAGNOSIS — M315 Giant cell arteritis with polymyalgia rheumatica: Secondary | ICD-10-CM | POA: Diagnosis not present

## 2016-05-17 DIAGNOSIS — M316 Other giant cell arteritis: Secondary | ICD-10-CM

## 2016-05-17 MED ORDER — TOCILIZUMAB 400 MG/20ML IV SOLN
360.0000 mg | Freq: Once | INTRAVENOUS | Status: AC
  Administered 2016-05-17: 360 mg via INTRAVENOUS
  Filled 2016-05-17: qty 18

## 2016-05-17 MED ORDER — SODIUM CHLORIDE 0.9 % IV SOLN
Freq: Once | INTRAVENOUS | Status: AC
  Administered 2016-05-17: 10:00:00 via INTRAVENOUS
  Filled 2016-05-17: qty 1000

## 2016-06-14 ENCOUNTER — Inpatient Hospital Stay: Payer: Medicare Other | Attending: Hematology and Oncology

## 2016-06-14 VITALS — BP 121/80 | HR 84 | Temp 97.6°F | Resp 18

## 2016-06-14 DIAGNOSIS — Z79899 Other long term (current) drug therapy: Secondary | ICD-10-CM | POA: Diagnosis not present

## 2016-06-14 DIAGNOSIS — M315 Giant cell arteritis with polymyalgia rheumatica: Secondary | ICD-10-CM | POA: Insufficient documentation

## 2016-06-14 DIAGNOSIS — M316 Other giant cell arteritis: Secondary | ICD-10-CM

## 2016-06-14 MED ORDER — SODIUM CHLORIDE 0.9 % IV SOLN
Freq: Once | INTRAVENOUS | Status: AC
  Administered 2016-06-14: 10:00:00 via INTRAVENOUS
  Filled 2016-06-14: qty 1000

## 2016-06-14 MED ORDER — TOCILIZUMAB 400 MG/20ML IV SOLN
360.0000 mg | Freq: Once | INTRAVENOUS | Status: AC
  Administered 2016-06-14: 360 mg via INTRAVENOUS
  Filled 2016-06-14: qty 18

## 2016-07-12 ENCOUNTER — Inpatient Hospital Stay: Payer: Medicare Other | Attending: Hematology and Oncology

## 2016-07-12 VITALS — BP 109/74 | HR 83 | Temp 95.2°F | Resp 18

## 2016-07-12 DIAGNOSIS — I7300 Raynaud's syndrome without gangrene: Secondary | ICD-10-CM | POA: Insufficient documentation

## 2016-07-12 DIAGNOSIS — Z79899 Other long term (current) drug therapy: Secondary | ICD-10-CM | POA: Diagnosis not present

## 2016-07-12 DIAGNOSIS — M315 Giant cell arteritis with polymyalgia rheumatica: Secondary | ICD-10-CM | POA: Diagnosis not present

## 2016-07-12 DIAGNOSIS — M316 Other giant cell arteritis: Secondary | ICD-10-CM

## 2016-07-12 MED ORDER — TOCILIZUMAB 400 MG/20ML IV SOLN
360.0000 mg | Freq: Once | INTRAVENOUS | Status: AC
  Administered 2016-07-12: 360 mg via INTRAVENOUS
  Filled 2016-07-12: qty 18

## 2016-07-12 MED ORDER — SODIUM CHLORIDE 0.9 % IV SOLN
Freq: Once | INTRAVENOUS | Status: AC
  Administered 2016-07-12: 10:00:00 via INTRAVENOUS
  Filled 2016-07-12: qty 1000

## 2016-08-09 ENCOUNTER — Inpatient Hospital Stay: Payer: Medicare Other

## 2016-08-09 VITALS — BP 114/75 | HR 68 | Temp 97.2°F | Resp 18

## 2016-08-09 DIAGNOSIS — M316 Other giant cell arteritis: Secondary | ICD-10-CM

## 2016-08-09 DIAGNOSIS — Z79899 Other long term (current) drug therapy: Secondary | ICD-10-CM | POA: Diagnosis not present

## 2016-08-09 DIAGNOSIS — I7300 Raynaud's syndrome without gangrene: Secondary | ICD-10-CM | POA: Diagnosis not present

## 2016-08-09 DIAGNOSIS — M315 Giant cell arteritis with polymyalgia rheumatica: Secondary | ICD-10-CM | POA: Diagnosis not present

## 2016-08-09 MED ORDER — SODIUM CHLORIDE 0.9 % IV SOLN
Freq: Once | INTRAVENOUS | Status: AC
  Administered 2016-08-09: 10:00:00 via INTRAVENOUS
  Filled 2016-08-09: qty 1000

## 2016-08-09 MED ORDER — SODIUM CHLORIDE 0.9 % IV SOLN
360.0000 mg | Freq: Once | INTRAVENOUS | Status: AC
  Administered 2016-08-09: 360 mg via INTRAVENOUS
  Filled 2016-08-09: qty 18

## 2016-09-02 ENCOUNTER — Telehealth: Payer: Self-pay | Admitting: *Deleted

## 2016-09-02 NOTE — Telephone Encounter (Signed)
Received faxed lab orders from othopedics; I called patient to let her know the cancer center does not draw and process other physicians labwork. If patient would prefer, we could draw the blood tubes and she could carry the tubes to the main lab at [facility] and register those labs under the orthopedic physician for processing.Patient states "ya'll have new rules every time I turn around" and told me to just "forget it."

## 2016-09-02 NOTE — Telephone Encounter (Signed)
noted 

## 2016-09-06 ENCOUNTER — Inpatient Hospital Stay: Payer: Medicare Other

## 2016-09-06 ENCOUNTER — Inpatient Hospital Stay: Payer: Medicare Other | Attending: Hematology and Oncology

## 2016-09-06 VITALS — BP 118/75 | HR 74 | Temp 97.6°F | Resp 18

## 2016-09-06 DIAGNOSIS — Z79899 Other long term (current) drug therapy: Secondary | ICD-10-CM | POA: Diagnosis not present

## 2016-09-06 DIAGNOSIS — M316 Other giant cell arteritis: Secondary | ICD-10-CM | POA: Diagnosis not present

## 2016-09-06 DIAGNOSIS — M353 Polymyalgia rheumatica: Secondary | ICD-10-CM | POA: Diagnosis not present

## 2016-09-06 DIAGNOSIS — M315 Giant cell arteritis with polymyalgia rheumatica: Secondary | ICD-10-CM | POA: Insufficient documentation

## 2016-09-06 LAB — CBC WITH DIFFERENTIAL/PLATELET
Basophils Absolute: 0 10*3/uL (ref 0–0.1)
Basophils Relative: 0 %
EOS PCT: 2 %
Eosinophils Absolute: 0.1 10*3/uL (ref 0–0.7)
HEMATOCRIT: 44 % (ref 35.0–47.0)
Hemoglobin: 14.6 g/dL (ref 12.0–16.0)
LYMPHS ABS: 0.8 10*3/uL — AB (ref 1.0–3.6)
LYMPHS PCT: 10 %
MCH: 29.4 pg (ref 26.0–34.0)
MCHC: 33.2 g/dL (ref 32.0–36.0)
MCV: 88.5 fL (ref 80.0–100.0)
MONO ABS: 0.3 10*3/uL (ref 0.2–0.9)
Monocytes Relative: 3 %
NEUTROS ABS: 7 10*3/uL — AB (ref 1.4–6.5)
Neutrophils Relative %: 85 %
PLATELETS: 191 10*3/uL (ref 150–440)
RBC: 4.97 MIL/uL (ref 3.80–5.20)
RDW: 13.5 % (ref 11.5–14.5)
WBC: 8.2 10*3/uL (ref 3.6–11.0)

## 2016-09-06 LAB — COMPREHENSIVE METABOLIC PANEL
ALT: 25 U/L (ref 14–54)
AST: 25 U/L (ref 15–41)
Albumin: 4.4 g/dL (ref 3.5–5.0)
Alkaline Phosphatase: 74 U/L (ref 38–126)
Anion gap: 11 (ref 5–15)
BILIRUBIN TOTAL: 0.7 mg/dL (ref 0.3–1.2)
BUN: 20 mg/dL (ref 6–20)
CALCIUM: 9.1 mg/dL (ref 8.9–10.3)
CHLORIDE: 102 mmol/L (ref 101–111)
CO2: 25 mmol/L (ref 22–32)
CREATININE: 0.83 mg/dL (ref 0.44–1.00)
Glucose, Bld: 124 mg/dL — ABNORMAL HIGH (ref 65–99)
Potassium: 4 mmol/L (ref 3.5–5.1)
Sodium: 138 mmol/L (ref 135–145)
TOTAL PROTEIN: 7.1 g/dL (ref 6.5–8.1)

## 2016-09-06 LAB — SEDIMENTATION RATE: SED RATE: 1 mm/h (ref 0–30)

## 2016-09-06 LAB — C-REACTIVE PROTEIN

## 2016-09-06 MED ORDER — TOCILIZUMAB 400 MG/20ML IV SOLN
360.0000 mg | Freq: Once | INTRAVENOUS | Status: AC
  Administered 2016-09-06: 360 mg via INTRAVENOUS
  Filled 2016-09-06: qty 8

## 2016-10-03 MED ORDER — TOCILIZUMAB 400 MG/20ML IV SOLN
360.0000 mg | Freq: Once | INTRAVENOUS | Status: AC
  Administered 2016-10-04: 360 mg via INTRAVENOUS
  Filled 2016-10-03: qty 10

## 2016-10-04 ENCOUNTER — Inpatient Hospital Stay: Payer: Medicare Other | Attending: Hematology and Oncology

## 2016-10-04 VITALS — BP 118/78 | HR 68 | Temp 95.4°F | Resp 18

## 2016-10-04 DIAGNOSIS — I7300 Raynaud's syndrome without gangrene: Secondary | ICD-10-CM | POA: Diagnosis not present

## 2016-10-04 DIAGNOSIS — M315 Giant cell arteritis with polymyalgia rheumatica: Secondary | ICD-10-CM | POA: Diagnosis not present

## 2016-10-04 DIAGNOSIS — Z79899 Other long term (current) drug therapy: Secondary | ICD-10-CM | POA: Insufficient documentation

## 2016-10-04 DIAGNOSIS — M316 Other giant cell arteritis: Secondary | ICD-10-CM

## 2016-10-04 MED ORDER — SODIUM CHLORIDE 0.9 % IV SOLN
Freq: Once | INTRAVENOUS | Status: AC
  Administered 2016-10-04: 10:00:00 via INTRAVENOUS
  Filled 2016-10-04: qty 1000

## 2016-10-04 NOTE — Patient Instructions (Signed)
Tocilizumab injection What is this medicine? TOCILIZUMAB (TOE si LIZ ue mab) is a monoclonal antibody. It is used to treat rheumatoid arthritis, juvenile idiopathic arthritis, giant cell arteritis, and cytokine release syndrome due to chimeric antigen receptor (CAR) T-cell treatment. This medicine may be used for other purposes; ask your health care provider or pharmacist if you have questions. COMMON BRAND NAME(S): Actemra What should I tell my health care provider before I take this medicine? They need to know if you have any of these conditions: -cancer -diabetes -diverticulitis -heart disease -hepatitis B or history of hepatitis B infection -high blood pressure -high cholesterol -history of pancreatitis -immune system problems -infection (especially a virus infection such as chickenpox, cold sores, or herpes) -liver disease -low blood counts, like low white cell, platelet, or red cell counts -multiple sclerosis -recently received or scheduled to receive a vaccine -scheduled to have surgery -stomach ulcer -stroke -tuberculosis, a positive skin test for tuberculosis or have recently been in close contact with someone who has tuberculosis -an unusual or allergic reaction to tocilizumab, other medicines, foods, dyes, or preservatives -pregnant or trying to get pregnant -breast-feeding How should I use this medicine? This medicine is for infusion into a vein or for injection under the skin. It is usually given by a health care professional in a hospital or clinic setting. If you get this medicine at home, you will be taught how to prepare and give this medicine. Use exactly as directed. Take your medicine at regular intervals. Do not take your medicine more often than directed. It is important that you put your used needles and syringes in a special sharps container. Do not put them in a trash can. If you do not have a sharps container, call your pharmacist or healthcare provider to get  one. A special MedGuide will be given to you by the pharmacist with each prescription and refill. Be sure to read this information carefully each time. Talk to your pediatrician regarding the use of this medicine in children. While the drug may be prescribed for children as young as 2 years for selected conditions, precautions do apply. Overdosage: If you think you have taken too much of this medicine contact a poison control center or emergency room at once. NOTE: This medicine is only for you. Do not share this medicine with others. What if I miss a dose? It is important not to miss your dose. Call your doctor or health care professional if you are unable to keep an appointment. If you give yourself the medicine and you miss a dose, take it as soon as you can. If it is almost time for your next dose, take only that dose. Do not take double or extra doses. What may interact with this medicine? Do not take this medicine with any of the following medications: -live virus vaccines This medicine may also interact with the following medications: -abatacept -adalimumab -anakinra -atorvastatin -certolizumab -cyclosporine -dextromethorphan -etanercept -golimumab -infliximab -lovastatin -medicines that lower the immune system -ofatumumab -omeprazole -oral contraceptives -rituximab -simvastatin -steroid medicines like prednisone or cortisone -theophylline -tositumomab -vaccines -warfarin This list may not describe all possible interactions. Give your health care provider a list of all the medicines, herbs, non-prescription drugs, or dietary supplements you use. Also tell them if you smoke, drink alcohol, or use illegal drugs. Some items may interact with your medicine. What should I watch for while using this medicine? Tell your doctor or healthcare professional if your symptoms do not start to get better or   if they get worse. Your condition will be monitored carefully while you are  receiving this medicine. You will be tested for tuberculosis (TB) before you start this medicine. If your doctor prescribes any medicine for TB, you should start taking the TB medicine before starting this medicine. Make sure to finish the full course of TB medicine. Talk to your doctor about your risk of cancer. You may be more at risk for certain types of cancers if you take this medicine. Call your doctor or health care professional for advice if you get a fever, chills or sore throat, or other symptoms of a cold or flu. Do not treat yourself. This drug decreases your body's ability to fight infections. Try to avoid being around people who are sick. You may need blood work done while you are taking this medicine. What side effects may I notice from receiving this medicine? Side effects that you should report to your doctor or health care professional as soon as possible: -allergic reactions like skin rash, itching or hives, swelling of the face, lips, or tongue -breathing problems -feeling faint or lightheaded, falls -fever or chills, sore throat -stomach pain -unusual bleeding or bruising -yellowing of the eyes or skin Side effects that usually do not require medical attention (report to your doctor or health care professional if they continue or are bothersome): -dizziness -headache -pain, redness, or irritation at site where injected This list may not describe all possible side effects. Call your doctor for medical advice about side effects. You may report side effects to FDA at 1-800-FDA-1088. Where should I keep my medicine? Keep out of the reach of children. If you are using this medicine at home, you will be instructed on how to store this medicine. Throw away any unused medicine after the expiration date on the label. NOTE: This sheet is a summary. It may not cover all possible information. If you have questions about this medicine, talk to your doctor, pharmacist, or health care  provider.  2018 Elsevier/Gold Standard (2016-03-15 12:55:30)  

## 2016-10-25 ENCOUNTER — Other Ambulatory Visit: Payer: Medicare Other

## 2016-10-25 ENCOUNTER — Ambulatory Visit: Payer: Medicare Other | Admitting: Internal Medicine

## 2016-10-25 ENCOUNTER — Ambulatory Visit: Payer: Medicare Other

## 2016-11-01 ENCOUNTER — Ambulatory Visit: Payer: Medicare Other | Admitting: Internal Medicine

## 2016-11-01 ENCOUNTER — Other Ambulatory Visit: Payer: Medicare Other

## 2016-11-01 ENCOUNTER — Ambulatory Visit: Payer: Medicare Other

## 2016-12-16 DIAGNOSIS — I7300 Raynaud's syndrome without gangrene: Secondary | ICD-10-CM | POA: Diagnosis not present

## 2016-12-16 DIAGNOSIS — Z13220 Encounter for screening for lipoid disorders: Secondary | ICD-10-CM | POA: Diagnosis not present

## 2016-12-16 DIAGNOSIS — E559 Vitamin D deficiency, unspecified: Secondary | ICD-10-CM | POA: Diagnosis not present

## 2016-12-16 DIAGNOSIS — R400 Somnolence: Secondary | ICD-10-CM | POA: Diagnosis not present

## 2016-12-16 DIAGNOSIS — Z1231 Encounter for screening mammogram for malignant neoplasm of breast: Secondary | ICD-10-CM | POA: Diagnosis not present

## 2016-12-16 DIAGNOSIS — Z1329 Encounter for screening for other suspected endocrine disorder: Secondary | ICD-10-CM | POA: Diagnosis not present

## 2016-12-16 DIAGNOSIS — M316 Other giant cell arteritis: Secondary | ICD-10-CM | POA: Diagnosis not present

## 2016-12-16 DIAGNOSIS — R0683 Snoring: Secondary | ICD-10-CM | POA: Diagnosis not present

## 2016-12-16 DIAGNOSIS — Z7952 Long term (current) use of systemic steroids: Secondary | ICD-10-CM | POA: Diagnosis not present

## 2016-12-16 DIAGNOSIS — Z6841 Body Mass Index (BMI) 40.0 and over, adult: Secondary | ICD-10-CM | POA: Diagnosis not present

## 2016-12-16 DIAGNOSIS — H6591 Unspecified nonsuppurative otitis media, right ear: Secondary | ICD-10-CM | POA: Diagnosis not present

## 2016-12-27 DIAGNOSIS — M353 Polymyalgia rheumatica: Secondary | ICD-10-CM | POA: Diagnosis not present

## 2016-12-27 DIAGNOSIS — M316 Other giant cell arteritis: Secondary | ICD-10-CM | POA: Diagnosis not present

## 2017-01-12 DIAGNOSIS — Z1231 Encounter for screening mammogram for malignant neoplasm of breast: Secondary | ICD-10-CM | POA: Diagnosis not present

## 2017-01-25 DIAGNOSIS — R928 Other abnormal and inconclusive findings on diagnostic imaging of breast: Secondary | ICD-10-CM | POA: Diagnosis not present

## 2017-01-25 DIAGNOSIS — N6311 Unspecified lump in the right breast, upper outer quadrant: Secondary | ICD-10-CM | POA: Diagnosis not present

## 2017-01-25 DIAGNOSIS — R921 Mammographic calcification found on diagnostic imaging of breast: Secondary | ICD-10-CM | POA: Diagnosis not present

## 2017-01-26 DIAGNOSIS — E782 Mixed hyperlipidemia: Secondary | ICD-10-CM | POA: Diagnosis not present

## 2017-01-26 DIAGNOSIS — E559 Vitamin D deficiency, unspecified: Secondary | ICD-10-CM | POA: Diagnosis not present

## 2017-01-26 DIAGNOSIS — Z1151 Encounter for screening for human papillomavirus (HPV): Secondary | ICD-10-CM | POA: Diagnosis not present

## 2017-01-26 DIAGNOSIS — Z23 Encounter for immunization: Secondary | ICD-10-CM | POA: Diagnosis not present

## 2017-01-26 DIAGNOSIS — Z124 Encounter for screening for malignant neoplasm of cervix: Secondary | ICD-10-CM | POA: Diagnosis not present

## 2017-01-26 DIAGNOSIS — Z0000 Encounter for general adult medical examination without abnormal findings: Secondary | ICD-10-CM | POA: Diagnosis not present

## 2017-01-26 DIAGNOSIS — R413 Other amnesia: Secondary | ICD-10-CM | POA: Diagnosis not present

## 2017-01-26 DIAGNOSIS — Z01419 Encounter for gynecological examination (general) (routine) without abnormal findings: Secondary | ICD-10-CM | POA: Diagnosis not present

## 2017-01-26 DIAGNOSIS — N952 Postmenopausal atrophic vaginitis: Secondary | ICD-10-CM | POA: Diagnosis not present

## 2017-01-26 DIAGNOSIS — Z6841 Body Mass Index (BMI) 40.0 and over, adult: Secondary | ICD-10-CM | POA: Diagnosis not present

## 2017-01-26 DIAGNOSIS — R7302 Impaired glucose tolerance (oral): Secondary | ICD-10-CM | POA: Diagnosis not present

## 2017-02-06 DIAGNOSIS — Z886 Allergy status to analgesic agent status: Secondary | ICD-10-CM | POA: Diagnosis not present

## 2017-02-06 DIAGNOSIS — Z7952 Long term (current) use of systemic steroids: Secondary | ICD-10-CM | POA: Diagnosis not present

## 2017-02-06 DIAGNOSIS — N6310 Unspecified lump in the right breast, unspecified quadrant: Secondary | ICD-10-CM | POA: Diagnosis not present

## 2017-02-06 DIAGNOSIS — C50911 Malignant neoplasm of unspecified site of right female breast: Secondary | ICD-10-CM | POA: Diagnosis not present

## 2017-02-06 DIAGNOSIS — Z8041 Family history of malignant neoplasm of ovary: Secondary | ICD-10-CM | POA: Diagnosis not present

## 2017-02-06 DIAGNOSIS — R921 Mammographic calcification found on diagnostic imaging of breast: Secondary | ICD-10-CM | POA: Diagnosis not present

## 2017-02-06 DIAGNOSIS — R928 Other abnormal and inconclusive findings on diagnostic imaging of breast: Secondary | ICD-10-CM | POA: Diagnosis not present

## 2017-02-06 DIAGNOSIS — N63 Unspecified lump in unspecified breast: Secondary | ICD-10-CM | POA: Diagnosis not present

## 2017-02-06 DIAGNOSIS — Z881 Allergy status to other antibiotic agents status: Secondary | ICD-10-CM | POA: Diagnosis not present

## 2017-02-06 DIAGNOSIS — Z6841 Body Mass Index (BMI) 40.0 and over, adult: Secondary | ICD-10-CM | POA: Diagnosis not present

## 2017-02-06 DIAGNOSIS — N6311 Unspecified lump in the right breast, upper outer quadrant: Secondary | ICD-10-CM | POA: Diagnosis not present

## 2017-02-06 DIAGNOSIS — Z882 Allergy status to sulfonamides status: Secondary | ICD-10-CM | POA: Diagnosis not present

## 2017-02-06 DIAGNOSIS — Z888 Allergy status to other drugs, medicaments and biological substances status: Secondary | ICD-10-CM | POA: Diagnosis not present

## 2017-02-06 DIAGNOSIS — Z885 Allergy status to narcotic agent status: Secondary | ICD-10-CM | POA: Diagnosis not present

## 2017-02-06 DIAGNOSIS — Z880 Allergy status to penicillin: Secondary | ICD-10-CM | POA: Diagnosis not present

## 2017-02-15 DIAGNOSIS — C50911 Malignant neoplasm of unspecified site of right female breast: Secondary | ICD-10-CM | POA: Diagnosis not present

## 2017-02-15 DIAGNOSIS — Z6841 Body Mass Index (BMI) 40.0 and over, adult: Secondary | ICD-10-CM | POA: Diagnosis not present

## 2017-02-15 DIAGNOSIS — Z170 Estrogen receptor positive status [ER+]: Secondary | ICD-10-CM | POA: Diagnosis not present

## 2017-02-15 DIAGNOSIS — N6311 Unspecified lump in the right breast, upper outer quadrant: Secondary | ICD-10-CM | POA: Diagnosis not present

## 2017-02-15 DIAGNOSIS — C50411 Malignant neoplasm of upper-outer quadrant of right female breast: Secondary | ICD-10-CM | POA: Diagnosis not present

## 2017-02-15 DIAGNOSIS — Z8041 Family history of malignant neoplasm of ovary: Secondary | ICD-10-CM | POA: Diagnosis not present

## 2017-03-02 DIAGNOSIS — C50911 Malignant neoplasm of unspecified site of right female breast: Secondary | ICD-10-CM | POA: Diagnosis not present

## 2017-03-02 DIAGNOSIS — Z170 Estrogen receptor positive status [ER+]: Secondary | ICD-10-CM | POA: Diagnosis not present

## 2017-03-02 DIAGNOSIS — D0511 Intraductal carcinoma in situ of right breast: Secondary | ICD-10-CM | POA: Diagnosis not present

## 2017-03-02 DIAGNOSIS — E669 Obesity, unspecified: Secondary | ICD-10-CM | POA: Diagnosis not present

## 2017-03-02 DIAGNOSIS — Z8041 Family history of malignant neoplasm of ovary: Secondary | ICD-10-CM | POA: Diagnosis not present

## 2017-03-02 DIAGNOSIS — Z6841 Body Mass Index (BMI) 40.0 and over, adult: Secondary | ICD-10-CM | POA: Diagnosis not present

## 2017-03-02 DIAGNOSIS — C50811 Malignant neoplasm of overlapping sites of right female breast: Secondary | ICD-10-CM | POA: Diagnosis not present

## 2017-03-02 DIAGNOSIS — L508 Other urticaria: Secondary | ICD-10-CM | POA: Diagnosis not present

## 2017-03-09 DIAGNOSIS — C50411 Malignant neoplasm of upper-outer quadrant of right female breast: Secondary | ICD-10-CM | POA: Diagnosis not present

## 2017-03-09 DIAGNOSIS — Z6841 Body Mass Index (BMI) 40.0 and over, adult: Secondary | ICD-10-CM | POA: Diagnosis not present

## 2017-03-09 DIAGNOSIS — Z170 Estrogen receptor positive status [ER+]: Secondary | ICD-10-CM | POA: Diagnosis not present

## 2017-03-09 DIAGNOSIS — C50911 Malignant neoplasm of unspecified site of right female breast: Secondary | ICD-10-CM | POA: Diagnosis not present

## 2017-03-21 DIAGNOSIS — C50911 Malignant neoplasm of unspecified site of right female breast: Secondary | ICD-10-CM | POA: Diagnosis not present

## 2017-03-21 DIAGNOSIS — C50411 Malignant neoplasm of upper-outer quadrant of right female breast: Secondary | ICD-10-CM | POA: Diagnosis not present

## 2017-03-21 DIAGNOSIS — Z170 Estrogen receptor positive status [ER+]: Secondary | ICD-10-CM | POA: Diagnosis not present

## 2017-03-21 DIAGNOSIS — Z6841 Body Mass Index (BMI) 40.0 and over, adult: Secondary | ICD-10-CM | POA: Diagnosis not present

## 2017-03-23 DIAGNOSIS — C50411 Malignant neoplasm of upper-outer quadrant of right female breast: Secondary | ICD-10-CM | POA: Diagnosis not present

## 2017-03-23 DIAGNOSIS — C50911 Malignant neoplasm of unspecified site of right female breast: Secondary | ICD-10-CM | POA: Diagnosis not present

## 2017-03-23 DIAGNOSIS — Z170 Estrogen receptor positive status [ER+]: Secondary | ICD-10-CM | POA: Diagnosis not present

## 2017-03-23 DIAGNOSIS — Z6841 Body Mass Index (BMI) 40.0 and over, adult: Secondary | ICD-10-CM | POA: Diagnosis not present

## 2017-03-29 DIAGNOSIS — C50911 Malignant neoplasm of unspecified site of right female breast: Secondary | ICD-10-CM | POA: Diagnosis not present

## 2017-03-29 DIAGNOSIS — Z170 Estrogen receptor positive status [ER+]: Secondary | ICD-10-CM | POA: Diagnosis not present

## 2017-04-04 DIAGNOSIS — C50411 Malignant neoplasm of upper-outer quadrant of right female breast: Secondary | ICD-10-CM | POA: Diagnosis not present

## 2017-04-04 DIAGNOSIS — Z6841 Body Mass Index (BMI) 40.0 and over, adult: Secondary | ICD-10-CM | POA: Diagnosis not present

## 2017-04-04 DIAGNOSIS — C50911 Malignant neoplasm of unspecified site of right female breast: Secondary | ICD-10-CM | POA: Diagnosis not present

## 2017-04-04 DIAGNOSIS — Z170 Estrogen receptor positive status [ER+]: Secondary | ICD-10-CM | POA: Diagnosis not present

## 2017-04-05 DIAGNOSIS — Z170 Estrogen receptor positive status [ER+]: Secondary | ICD-10-CM | POA: Diagnosis not present

## 2017-04-05 DIAGNOSIS — C50911 Malignant neoplasm of unspecified site of right female breast: Secondary | ICD-10-CM | POA: Diagnosis not present

## 2017-04-05 DIAGNOSIS — C50411 Malignant neoplasm of upper-outer quadrant of right female breast: Secondary | ICD-10-CM | POA: Diagnosis not present

## 2017-04-05 DIAGNOSIS — Z6841 Body Mass Index (BMI) 40.0 and over, adult: Secondary | ICD-10-CM | POA: Diagnosis not present

## 2017-04-06 DIAGNOSIS — Z170 Estrogen receptor positive status [ER+]: Secondary | ICD-10-CM | POA: Diagnosis not present

## 2017-04-06 DIAGNOSIS — Z6841 Body Mass Index (BMI) 40.0 and over, adult: Secondary | ICD-10-CM | POA: Diagnosis not present

## 2017-04-06 DIAGNOSIS — C50411 Malignant neoplasm of upper-outer quadrant of right female breast: Secondary | ICD-10-CM | POA: Diagnosis not present

## 2017-04-06 DIAGNOSIS — C50911 Malignant neoplasm of unspecified site of right female breast: Secondary | ICD-10-CM | POA: Diagnosis not present

## 2017-04-07 DIAGNOSIS — C50911 Malignant neoplasm of unspecified site of right female breast: Secondary | ICD-10-CM | POA: Diagnosis not present

## 2017-04-07 DIAGNOSIS — Z170 Estrogen receptor positive status [ER+]: Secondary | ICD-10-CM | POA: Diagnosis not present

## 2017-04-07 DIAGNOSIS — Z6841 Body Mass Index (BMI) 40.0 and over, adult: Secondary | ICD-10-CM | POA: Diagnosis not present

## 2017-04-07 DIAGNOSIS — C50411 Malignant neoplasm of upper-outer quadrant of right female breast: Secondary | ICD-10-CM | POA: Diagnosis not present

## 2017-04-10 DIAGNOSIS — Z6841 Body Mass Index (BMI) 40.0 and over, adult: Secondary | ICD-10-CM | POA: Diagnosis not present

## 2017-04-10 DIAGNOSIS — Z170 Estrogen receptor positive status [ER+]: Secondary | ICD-10-CM | POA: Diagnosis not present

## 2017-04-10 DIAGNOSIS — C50911 Malignant neoplasm of unspecified site of right female breast: Secondary | ICD-10-CM | POA: Diagnosis not present

## 2017-04-10 DIAGNOSIS — C50411 Malignant neoplasm of upper-outer quadrant of right female breast: Secondary | ICD-10-CM | POA: Diagnosis not present

## 2017-04-11 DIAGNOSIS — Z170 Estrogen receptor positive status [ER+]: Secondary | ICD-10-CM | POA: Diagnosis not present

## 2017-04-11 DIAGNOSIS — C50911 Malignant neoplasm of unspecified site of right female breast: Secondary | ICD-10-CM | POA: Diagnosis not present

## 2017-04-11 DIAGNOSIS — Z6841 Body Mass Index (BMI) 40.0 and over, adult: Secondary | ICD-10-CM | POA: Diagnosis not present

## 2017-04-11 DIAGNOSIS — C50411 Malignant neoplasm of upper-outer quadrant of right female breast: Secondary | ICD-10-CM | POA: Diagnosis not present

## 2017-04-12 DIAGNOSIS — C50911 Malignant neoplasm of unspecified site of right female breast: Secondary | ICD-10-CM | POA: Diagnosis not present

## 2017-04-12 DIAGNOSIS — C50411 Malignant neoplasm of upper-outer quadrant of right female breast: Secondary | ICD-10-CM | POA: Diagnosis not present

## 2017-04-12 DIAGNOSIS — Z6841 Body Mass Index (BMI) 40.0 and over, adult: Secondary | ICD-10-CM | POA: Diagnosis not present

## 2017-04-12 DIAGNOSIS — Z170 Estrogen receptor positive status [ER+]: Secondary | ICD-10-CM | POA: Diagnosis not present

## 2017-04-13 DIAGNOSIS — M353 Polymyalgia rheumatica: Secondary | ICD-10-CM | POA: Diagnosis not present

## 2017-04-13 DIAGNOSIS — M316 Other giant cell arteritis: Secondary | ICD-10-CM | POA: Diagnosis not present

## 2017-04-13 DIAGNOSIS — Z170 Estrogen receptor positive status [ER+]: Secondary | ICD-10-CM | POA: Diagnosis not present

## 2017-04-13 DIAGNOSIS — C50911 Malignant neoplasm of unspecified site of right female breast: Secondary | ICD-10-CM | POA: Diagnosis not present

## 2017-04-13 DIAGNOSIS — C50411 Malignant neoplasm of upper-outer quadrant of right female breast: Secondary | ICD-10-CM | POA: Diagnosis not present

## 2017-04-13 DIAGNOSIS — C50011 Malignant neoplasm of nipple and areola, right female breast: Secondary | ICD-10-CM | POA: Diagnosis not present

## 2017-04-13 DIAGNOSIS — Z6841 Body Mass Index (BMI) 40.0 and over, adult: Secondary | ICD-10-CM | POA: Diagnosis not present

## 2017-04-14 DIAGNOSIS — C50411 Malignant neoplasm of upper-outer quadrant of right female breast: Secondary | ICD-10-CM | POA: Diagnosis not present

## 2017-04-14 DIAGNOSIS — Z170 Estrogen receptor positive status [ER+]: Secondary | ICD-10-CM | POA: Diagnosis not present

## 2017-04-14 DIAGNOSIS — Z6841 Body Mass Index (BMI) 40.0 and over, adult: Secondary | ICD-10-CM | POA: Diagnosis not present

## 2017-04-14 DIAGNOSIS — C50911 Malignant neoplasm of unspecified site of right female breast: Secondary | ICD-10-CM | POA: Diagnosis not present

## 2017-04-17 DIAGNOSIS — Z6841 Body Mass Index (BMI) 40.0 and over, adult: Secondary | ICD-10-CM | POA: Diagnosis not present

## 2017-04-17 DIAGNOSIS — C50411 Malignant neoplasm of upper-outer quadrant of right female breast: Secondary | ICD-10-CM | POA: Diagnosis not present

## 2017-04-17 DIAGNOSIS — C50911 Malignant neoplasm of unspecified site of right female breast: Secondary | ICD-10-CM | POA: Diagnosis not present

## 2017-04-17 DIAGNOSIS — Z170 Estrogen receptor positive status [ER+]: Secondary | ICD-10-CM | POA: Diagnosis not present

## 2017-04-18 DIAGNOSIS — Z170 Estrogen receptor positive status [ER+]: Secondary | ICD-10-CM | POA: Diagnosis not present

## 2017-04-18 DIAGNOSIS — Z6841 Body Mass Index (BMI) 40.0 and over, adult: Secondary | ICD-10-CM | POA: Diagnosis not present

## 2017-04-18 DIAGNOSIS — C50911 Malignant neoplasm of unspecified site of right female breast: Secondary | ICD-10-CM | POA: Diagnosis not present

## 2017-04-18 DIAGNOSIS — C50411 Malignant neoplasm of upper-outer quadrant of right female breast: Secondary | ICD-10-CM | POA: Diagnosis not present

## 2017-04-19 DIAGNOSIS — C50411 Malignant neoplasm of upper-outer quadrant of right female breast: Secondary | ICD-10-CM | POA: Diagnosis not present

## 2017-04-19 DIAGNOSIS — C50911 Malignant neoplasm of unspecified site of right female breast: Secondary | ICD-10-CM | POA: Diagnosis not present

## 2017-04-19 DIAGNOSIS — Z6841 Body Mass Index (BMI) 40.0 and over, adult: Secondary | ICD-10-CM | POA: Diagnosis not present

## 2017-04-19 DIAGNOSIS — Z170 Estrogen receptor positive status [ER+]: Secondary | ICD-10-CM | POA: Diagnosis not present

## 2017-04-20 DIAGNOSIS — Z6841 Body Mass Index (BMI) 40.0 and over, adult: Secondary | ICD-10-CM | POA: Diagnosis not present

## 2017-04-20 DIAGNOSIS — C50911 Malignant neoplasm of unspecified site of right female breast: Secondary | ICD-10-CM | POA: Diagnosis not present

## 2017-04-20 DIAGNOSIS — Z170 Estrogen receptor positive status [ER+]: Secondary | ICD-10-CM | POA: Diagnosis not present

## 2017-04-20 DIAGNOSIS — C50411 Malignant neoplasm of upper-outer quadrant of right female breast: Secondary | ICD-10-CM | POA: Diagnosis not present

## 2017-04-21 DIAGNOSIS — C50911 Malignant neoplasm of unspecified site of right female breast: Secondary | ICD-10-CM | POA: Diagnosis not present

## 2017-04-21 DIAGNOSIS — Z6841 Body Mass Index (BMI) 40.0 and over, adult: Secondary | ICD-10-CM | POA: Diagnosis not present

## 2017-04-21 DIAGNOSIS — C50411 Malignant neoplasm of upper-outer quadrant of right female breast: Secondary | ICD-10-CM | POA: Diagnosis not present

## 2017-04-21 DIAGNOSIS — Z170 Estrogen receptor positive status [ER+]: Secondary | ICD-10-CM | POA: Diagnosis not present

## 2017-04-24 DIAGNOSIS — Z6841 Body Mass Index (BMI) 40.0 and over, adult: Secondary | ICD-10-CM | POA: Diagnosis not present

## 2017-04-24 DIAGNOSIS — C50911 Malignant neoplasm of unspecified site of right female breast: Secondary | ICD-10-CM | POA: Diagnosis not present

## 2017-04-24 DIAGNOSIS — C50411 Malignant neoplasm of upper-outer quadrant of right female breast: Secondary | ICD-10-CM | POA: Diagnosis not present

## 2017-04-24 DIAGNOSIS — Z170 Estrogen receptor positive status [ER+]: Secondary | ICD-10-CM | POA: Diagnosis not present

## 2017-04-25 DIAGNOSIS — C50911 Malignant neoplasm of unspecified site of right female breast: Secondary | ICD-10-CM | POA: Diagnosis not present

## 2017-04-25 DIAGNOSIS — C50411 Malignant neoplasm of upper-outer quadrant of right female breast: Secondary | ICD-10-CM | POA: Diagnosis not present

## 2017-04-25 DIAGNOSIS — Z170 Estrogen receptor positive status [ER+]: Secondary | ICD-10-CM | POA: Diagnosis not present

## 2017-04-25 DIAGNOSIS — Z6841 Body Mass Index (BMI) 40.0 and over, adult: Secondary | ICD-10-CM | POA: Diagnosis not present

## 2017-04-26 DIAGNOSIS — Z6841 Body Mass Index (BMI) 40.0 and over, adult: Secondary | ICD-10-CM | POA: Diagnosis not present

## 2017-04-26 DIAGNOSIS — C50911 Malignant neoplasm of unspecified site of right female breast: Secondary | ICD-10-CM | POA: Diagnosis not present

## 2017-04-26 DIAGNOSIS — C50411 Malignant neoplasm of upper-outer quadrant of right female breast: Secondary | ICD-10-CM | POA: Diagnosis not present

## 2017-04-26 DIAGNOSIS — Z170 Estrogen receptor positive status [ER+]: Secondary | ICD-10-CM | POA: Diagnosis not present

## 2017-04-27 DIAGNOSIS — Z170 Estrogen receptor positive status [ER+]: Secondary | ICD-10-CM | POA: Diagnosis not present

## 2017-04-27 DIAGNOSIS — C50411 Malignant neoplasm of upper-outer quadrant of right female breast: Secondary | ICD-10-CM | POA: Diagnosis not present

## 2017-04-27 DIAGNOSIS — Z6841 Body Mass Index (BMI) 40.0 and over, adult: Secondary | ICD-10-CM | POA: Diagnosis not present

## 2017-04-27 DIAGNOSIS — C50911 Malignant neoplasm of unspecified site of right female breast: Secondary | ICD-10-CM | POA: Diagnosis not present

## 2017-04-28 DIAGNOSIS — C50411 Malignant neoplasm of upper-outer quadrant of right female breast: Secondary | ICD-10-CM | POA: Diagnosis not present

## 2017-04-28 DIAGNOSIS — Z170 Estrogen receptor positive status [ER+]: Secondary | ICD-10-CM | POA: Diagnosis not present

## 2017-04-28 DIAGNOSIS — Z6841 Body Mass Index (BMI) 40.0 and over, adult: Secondary | ICD-10-CM | POA: Diagnosis not present

## 2017-04-28 DIAGNOSIS — C50911 Malignant neoplasm of unspecified site of right female breast: Secondary | ICD-10-CM | POA: Diagnosis not present

## 2017-05-01 DIAGNOSIS — Z6841 Body Mass Index (BMI) 40.0 and over, adult: Secondary | ICD-10-CM | POA: Diagnosis not present

## 2017-05-01 DIAGNOSIS — C50911 Malignant neoplasm of unspecified site of right female breast: Secondary | ICD-10-CM | POA: Diagnosis not present

## 2017-05-01 DIAGNOSIS — C50411 Malignant neoplasm of upper-outer quadrant of right female breast: Secondary | ICD-10-CM | POA: Diagnosis not present

## 2017-05-01 DIAGNOSIS — Z170 Estrogen receptor positive status [ER+]: Secondary | ICD-10-CM | POA: Diagnosis not present

## 2017-05-02 DIAGNOSIS — C50411 Malignant neoplasm of upper-outer quadrant of right female breast: Secondary | ICD-10-CM | POA: Diagnosis not present

## 2017-05-02 DIAGNOSIS — Z6841 Body Mass Index (BMI) 40.0 and over, adult: Secondary | ICD-10-CM | POA: Diagnosis not present

## 2017-05-02 DIAGNOSIS — C50911 Malignant neoplasm of unspecified site of right female breast: Secondary | ICD-10-CM | POA: Diagnosis not present

## 2017-05-02 DIAGNOSIS — Z170 Estrogen receptor positive status [ER+]: Secondary | ICD-10-CM | POA: Diagnosis not present

## 2017-05-29 DIAGNOSIS — C50911 Malignant neoplasm of unspecified site of right female breast: Secondary | ICD-10-CM | POA: Diagnosis not present

## 2017-05-29 DIAGNOSIS — Z170 Estrogen receptor positive status [ER+]: Secondary | ICD-10-CM | POA: Diagnosis not present

## 2017-05-29 DIAGNOSIS — Z6841 Body Mass Index (BMI) 40.0 and over, adult: Secondary | ICD-10-CM | POA: Diagnosis not present

## 2019-08-27 DIAGNOSIS — M353 Polymyalgia rheumatica: Secondary | ICD-10-CM | POA: Diagnosis not present

## 2019-08-27 DIAGNOSIS — M316 Other giant cell arteritis: Secondary | ICD-10-CM | POA: Diagnosis not present

## 2019-10-09 DIAGNOSIS — M316 Other giant cell arteritis: Secondary | ICD-10-CM | POA: Diagnosis not present

## 2019-10-09 DIAGNOSIS — Z79899 Other long term (current) drug therapy: Secondary | ICD-10-CM | POA: Diagnosis not present

## 2019-11-05 DIAGNOSIS — M316 Other giant cell arteritis: Secondary | ICD-10-CM | POA: Diagnosis not present

## 2019-12-26 DIAGNOSIS — M2550 Pain in unspecified joint: Secondary | ICD-10-CM | POA: Diagnosis not present

## 2019-12-26 DIAGNOSIS — R232 Flushing: Secondary | ICD-10-CM | POA: Diagnosis not present

## 2019-12-26 DIAGNOSIS — C50911 Malignant neoplasm of unspecified site of right female breast: Secondary | ICD-10-CM | POA: Diagnosis not present

## 2019-12-26 DIAGNOSIS — C50411 Malignant neoplasm of upper-outer quadrant of right female breast: Secondary | ICD-10-CM | POA: Diagnosis not present

## 2019-12-26 DIAGNOSIS — Z853 Personal history of malignant neoplasm of breast: Secondary | ICD-10-CM | POA: Diagnosis not present

## 2019-12-26 DIAGNOSIS — Z6836 Body mass index (BMI) 36.0-36.9, adult: Secondary | ICD-10-CM | POA: Diagnosis not present

## 2019-12-26 DIAGNOSIS — Z08 Encounter for follow-up examination after completed treatment for malignant neoplasm: Secondary | ICD-10-CM | POA: Diagnosis not present

## 2019-12-26 DIAGNOSIS — Z780 Asymptomatic menopausal state: Secondary | ICD-10-CM | POA: Diagnosis not present

## 2019-12-26 DIAGNOSIS — Z170 Estrogen receptor positive status [ER+]: Secondary | ICD-10-CM | POA: Diagnosis not present

## 2019-12-26 DIAGNOSIS — R5383 Other fatigue: Secondary | ICD-10-CM | POA: Diagnosis not present

## 2020-01-21 DIAGNOSIS — M315 Giant cell arteritis with polymyalgia rheumatica: Secondary | ICD-10-CM | POA: Diagnosis not present

## 2020-02-25 DIAGNOSIS — M353 Polymyalgia rheumatica: Secondary | ICD-10-CM | POA: Diagnosis not present

## 2020-02-25 DIAGNOSIS — M316 Other giant cell arteritis: Secondary | ICD-10-CM | POA: Diagnosis not present

## 2020-02-25 DIAGNOSIS — I7300 Raynaud's syndrome without gangrene: Secondary | ICD-10-CM | POA: Diagnosis not present

## 2020-03-24 DIAGNOSIS — Z13820 Encounter for screening for osteoporosis: Secondary | ICD-10-CM | POA: Diagnosis not present

## 2020-03-24 DIAGNOSIS — Z7952 Long term (current) use of systemic steroids: Secondary | ICD-10-CM | POA: Diagnosis not present

## 2020-03-24 DIAGNOSIS — M8588 Other specified disorders of bone density and structure, other site: Secondary | ICD-10-CM | POA: Diagnosis not present

## 2020-03-24 DIAGNOSIS — Z780 Asymptomatic menopausal state: Secondary | ICD-10-CM | POA: Diagnosis not present

## 2020-04-06 DIAGNOSIS — Z170 Estrogen receptor positive status [ER+]: Secondary | ICD-10-CM | POA: Diagnosis not present

## 2020-04-06 DIAGNOSIS — Z6834 Body mass index (BMI) 34.0-34.9, adult: Secondary | ICD-10-CM | POA: Diagnosis not present

## 2020-04-06 DIAGNOSIS — Z803 Family history of malignant neoplasm of breast: Secondary | ICD-10-CM | POA: Diagnosis not present

## 2020-04-06 DIAGNOSIS — C50611 Malignant neoplasm of axillary tail of right female breast: Secondary | ICD-10-CM | POA: Diagnosis not present

## 2020-04-06 DIAGNOSIS — R922 Inconclusive mammogram: Secondary | ICD-10-CM | POA: Diagnosis not present

## 2020-04-06 DIAGNOSIS — C50911 Malignant neoplasm of unspecified site of right female breast: Secondary | ICD-10-CM | POA: Diagnosis not present

## 2020-04-06 DIAGNOSIS — Z853 Personal history of malignant neoplasm of breast: Secondary | ICD-10-CM | POA: Diagnosis not present

## 2020-08-26 DIAGNOSIS — M316 Other giant cell arteritis: Secondary | ICD-10-CM | POA: Diagnosis not present

## 2020-08-26 DIAGNOSIS — M353 Polymyalgia rheumatica: Secondary | ICD-10-CM | POA: Diagnosis not present

## 2020-11-06 DIAGNOSIS — M316 Other giant cell arteritis: Secondary | ICD-10-CM | POA: Diagnosis not present

## 2020-11-06 DIAGNOSIS — Z79899 Other long term (current) drug therapy: Secondary | ICD-10-CM | POA: Diagnosis not present

## 2021-02-08 DIAGNOSIS — I7300 Raynaud's syndrome without gangrene: Secondary | ICD-10-CM | POA: Diagnosis not present

## 2021-02-08 DIAGNOSIS — M316 Other giant cell arteritis: Secondary | ICD-10-CM | POA: Diagnosis not present

## 2021-02-08 DIAGNOSIS — M353 Polymyalgia rheumatica: Secondary | ICD-10-CM | POA: Diagnosis not present

## 2021-03-12 DIAGNOSIS — Z20822 Contact with and (suspected) exposure to covid-19: Secondary | ICD-10-CM | POA: Diagnosis not present

## 2021-04-07 DIAGNOSIS — C50611 Malignant neoplasm of axillary tail of right female breast: Secondary | ICD-10-CM | POA: Diagnosis not present

## 2021-04-07 DIAGNOSIS — Z853 Personal history of malignant neoplasm of breast: Secondary | ICD-10-CM | POA: Diagnosis not present

## 2021-04-07 DIAGNOSIS — Z08 Encounter for follow-up examination after completed treatment for malignant neoplasm: Secondary | ICD-10-CM | POA: Diagnosis not present

## 2021-04-07 DIAGNOSIS — R921 Mammographic calcification found on diagnostic imaging of breast: Secondary | ICD-10-CM | POA: Diagnosis not present

## 2021-04-07 DIAGNOSIS — Z170 Estrogen receptor positive status [ER+]: Secondary | ICD-10-CM | POA: Diagnosis not present

## 2021-04-10 DIAGNOSIS — Z20822 Contact with and (suspected) exposure to covid-19: Secondary | ICD-10-CM | POA: Diagnosis not present

## 2021-04-14 DIAGNOSIS — M316 Other giant cell arteritis: Secondary | ICD-10-CM | POA: Diagnosis not present

## 2021-04-14 DIAGNOSIS — Z7952 Long term (current) use of systemic steroids: Secondary | ICD-10-CM | POA: Diagnosis not present

## 2021-04-14 DIAGNOSIS — Z111 Encounter for screening for respiratory tuberculosis: Secondary | ICD-10-CM | POA: Diagnosis not present

## 2021-04-14 DIAGNOSIS — I7300 Raynaud's syndrome without gangrene: Secondary | ICD-10-CM | POA: Diagnosis not present

## 2021-04-19 DIAGNOSIS — C50911 Malignant neoplasm of unspecified site of right female breast: Secondary | ICD-10-CM | POA: Diagnosis not present

## 2021-04-19 DIAGNOSIS — Z6832 Body mass index (BMI) 32.0-32.9, adult: Secondary | ICD-10-CM | POA: Diagnosis not present

## 2021-04-19 DIAGNOSIS — C50611 Malignant neoplasm of axillary tail of right female breast: Secondary | ICD-10-CM | POA: Diagnosis not present

## 2021-05-11 DIAGNOSIS — Z20822 Contact with and (suspected) exposure to covid-19: Secondary | ICD-10-CM | POA: Diagnosis not present

## 2021-05-24 DIAGNOSIS — M316 Other giant cell arteritis: Secondary | ICD-10-CM | POA: Diagnosis not present

## 2021-06-23 DIAGNOSIS — M316 Other giant cell arteritis: Secondary | ICD-10-CM | POA: Diagnosis not present

## 2021-07-15 DIAGNOSIS — Z7960 Long term (current) use of unspecified immunomodulators and immunosuppressants: Secondary | ICD-10-CM | POA: Diagnosis not present

## 2021-07-15 DIAGNOSIS — M316 Other giant cell arteritis: Secondary | ICD-10-CM | POA: Diagnosis not present

## 2021-07-15 DIAGNOSIS — I7300 Raynaud's syndrome without gangrene: Secondary | ICD-10-CM | POA: Diagnosis not present

## 2021-07-15 DIAGNOSIS — Z7952 Long term (current) use of systemic steroids: Secondary | ICD-10-CM | POA: Diagnosis not present

## 2021-07-22 DIAGNOSIS — M316 Other giant cell arteritis: Secondary | ICD-10-CM | POA: Diagnosis not present

## 2021-08-23 DIAGNOSIS — Z7960 Long term (current) use of unspecified immunomodulators and immunosuppressants: Secondary | ICD-10-CM | POA: Diagnosis not present

## 2021-08-23 DIAGNOSIS — M316 Other giant cell arteritis: Secondary | ICD-10-CM | POA: Diagnosis not present

## 2021-09-21 DIAGNOSIS — M316 Other giant cell arteritis: Secondary | ICD-10-CM | POA: Diagnosis not present

## 2021-10-13 DIAGNOSIS — Z6841 Body Mass Index (BMI) 40.0 and over, adult: Secondary | ICD-10-CM | POA: Diagnosis not present

## 2021-10-13 DIAGNOSIS — Z7960 Long term (current) use of unspecified immunomodulators and immunosuppressants: Secondary | ICD-10-CM | POA: Diagnosis not present

## 2021-10-13 DIAGNOSIS — M316 Other giant cell arteritis: Secondary | ICD-10-CM | POA: Diagnosis not present

## 2021-10-19 DIAGNOSIS — M316 Other giant cell arteritis: Secondary | ICD-10-CM | POA: Diagnosis not present

## 2021-11-11 ENCOUNTER — Ambulatory Visit
Admission: RE | Admit: 2021-11-11 | Discharge: 2021-11-11 | Disposition: A | Discharge status: Home / Self Care | Payer: Medicare Other | Source: Ambulatory Visit

## 2021-11-11 VITALS — BP 120/83 | HR 87 | Temp 97.9°F | Resp 18 | Ht 59.5 in

## 2021-11-11 DIAGNOSIS — I83811 Varicose veins of right lower extremities with pain: Secondary | ICD-10-CM

## 2021-11-11 NOTE — ED Provider Notes (Signed)
?[facility] ? ? ? ?CSN: [id number] ?Arrival date & time: 11/11/21  1153 ? ? ?  ? ?History   ?Chief Complaint ?Chief Complaint  ?Patient presents with  ? Leg Pain  ?  Right  ? ? ?HPI ?Phyllis Warner is a 71 y.o. female presenting for right lower leg pain for the past 3 days or so.  Patient reports history of varicose veins in her lower legs.  States they look more swollen on the right side.  They are tender when palpated.  Denies any noticeable swelling of the lower extremities, increased warmth or erythema.  Denies associated fever.  Denies any recent surgeries or travel.  She does have history of polymyalgia rheumatica, giant cell arteritis, Raynaud's disease, temporal arteritis and states she is coming up on 5 years in remission from breast cancer.  Patient denies ever having history of DVT or PE but that is a concern.  No other complaints. ? ?HPI ? ?Past Medical History:  ?Diagnosis Date  ? Giant cell arteritis ([place])   ? Polymyalgia rheumatica ([place])   ? Raynaud's disease   ? Reflux   ? ? ?Patient Active Problem List  ? Diagnosis Date Noted  ? Temporal arteritis ([place]) 05/17/2013  ? Paroxysmal digital cyanosis 04/04/2012  ? Post menopausal syndrome 04/04/2012  ? ? ?Past Surgical History:  ?Procedure Laterality Date  ? LASIK    ? TEMPORAL ARTERY BIOPSY / LIGATION    ? ? ?OB History   ?No obstetric history on file. ?  ? ? ? ?Home Medications   ? ?Prior to Admission medications   ?Medication Sig Start Date End Date Taking? Authorizing Provider  ?cetirizine (ZYRTEC) 10 MG tablet Take by mouth.    [provider]  ?Cholecalciferol (VITAMIN D3) 2000 units capsule Take by mouth.    [provider]  ?[facility] HEARTBURN RELIEF 200 MG tablet Take 200 mg by mouth 2 (two) times daily. 09/30/15   [provider]  ?folic acid (FOLVITE) 1 MG tablet  03/04/13   [provider]  ?hydrOXYzine (ATARAX/VISTARIL) 25 MG tablet TAKE 1 TABLET(S) 3 TIMES A DAY BY ORAL ROUTE. 09/21/15   [provider]  ?meloxicam (MOBIC) 15 MG tablet TAKE 1 TABLET(S) EVERY DAY BY ORAL ROUTE FOR 30 DAYS. 10/31/15   [provider]  ?Baruch Gouty     [provider]  ?norethindrone-ethinyl estradiol (FEMHRT LOW DOSE) 0.5-2.5 MG-MCG tablet  03/12/13   [provider]  ?OVER THE COUNTER MEDICATION Take 30 mLs by mouth daily. K62 Nitric Balance supplement, prescribed by chiropractor    [provider]  ?predniSONE (DELTASONE) 5 MG tablet  03/13/13   [provider]  ? ? ?Family History ?Family History  ?Problem Relation Age of Onset  ? Cancer Mother   ?     ovarian  ? ? ?Social History ?Social History  ? ?Tobacco Use  ? Smoking status: Never  ? Smokeless tobacco: Never  ?Substance Use Topics  ? Alcohol use: No  ? Drug use: No  ? ? ? ?Allergies   ?Hydroxyzine, Valacyclovir, Albuterol, Aspirin, Clarithromycin, Erythromycin, Ketorolac tromethamine, Penicillins, Sulfa antibiotics, Codeine, and Doxycycline hyclate ? ? ?Review of Systems ?Review of Systems  ?Constitutional:  Negative for fatigue and fever.  ?Respiratory:  Negative for chest tightness and shortness of breath.   ?Cardiovascular:  Negative for chest pain.  ?Musculoskeletal:  Positive for arthralgias. Negative for gait problem and joint swelling.  ?Skin:  Negative for color change and wound.  ?  Neurological:  Negative for weakness and numbness.  ? ? ?Physical Exam ?Triage Vital Signs ?ED Triage Vitals  ?Enc Vitals Group  ?   BP   ?   Pulse   ?   Resp   ?   Temp   ?   Temp src   ?   SpO2   ?   Weight   ?   Height   ?   Head Circumference   ?   Peak Flow   ?   Pain Score   ?   Pain Loc   ?   Pain Edu?   ?   Excl. in [place]?   ? ?No data found. ? ?Updated Vital Signs ?BP 120/83 (BP Location: Left Arm)   Pulse 87   Temp 97.9 ?F (36.6 ?C) (Oral)   Resp 18   Ht 4' 11.5" (1.511 m)   SpO2 99%   BMI 42.64 kg/m?  ?   ? ?Physical Exam ?Vitals and nursing note reviewed.  ?Constitutional:   ?   General: She is not in acute distress. ?    Appearance: Normal appearance. She is not ill-appearing or toxic-appearing.  ?HENT:  ?   Head: Normocephalic and atraumatic.  ?Eyes:  ?   General: No scleral icterus.    ?   Right eye: No discharge.     ?   Left eye: No discharge.  ?   Conjunctiva/sclera: Conjunctivae normal.  ?Cardiovascular:  ?   Rate and Rhythm: Normal rate and regular rhythm.  ?   Heart sounds: Normal heart sounds.  ?Pulmonary:  ?   Effort: Pulmonary effort is normal. No respiratory distress.  ?   Breath sounds: Normal breath sounds.  ?Musculoskeletal:  ?   Cervical back: Neck supple.  ?   Comments: Right lower extremity: No appreciable swelling as compared to the opposite extremity.  Patient has obvious varicose veins that are tortuous of both lower extremities but more noticeable on the right.  There are multiple areas of increased firmness of the right lower anterior leg varicose veins.  No surrounding erythema, warmth.  Areas are tender to palpation.  She does have full range of motion of ankle and knee.  ?Skin: ?   General: Skin is dry.  ?Neurological:  ?   General: No focal deficit present.  ?   Mental Status: She is alert. Mental status is at baseline.  ?   Motor: No weakness.  ?   Gait: Gait normal.  ?Psychiatric:     ?   Mood and Affect: Mood normal.     ?   Behavior: Behavior normal.     ?   Thought Content: Thought content normal.  ? ? ? ?UC Treatments / Results  ?Labs ?(all labs ordered are listed, but only abnormal results are displayed) ?Labs Reviewed - No data to display ? ?EKG ? ? ?Radiology ?No results found. ? ?Procedures ?Procedures (including critical care time) ? ?Medications Ordered in UC ?Medications - No data to display ? ?Initial Impression / Assessment and Plan / UC Course  ?I have reviewed the triage vital signs and the nursing notes. ? ?Pertinent labs & imaging results that were available during my care of the patient were reviewed by me and considered in my medical decision making (see chart for  details). ? ?71 year old female with history of autoimmune disease, varicose veins, and breast cancer presents for right lower extremity pain.  Patient says the varicose veins in her right lower leg  are painful to touch.  Recalls a minor injury where she hit her leg on something a few days ago.  No recent surgeries or travel.  No history of DVT or PE.  Medicating with ibuprofen.  Has also tried ice.  Patient is not currently having much pain in her lower leg and she has no appreciable swelling, erythema or warmth.  Discussed obtaining an ultrasound of her right lower extremity to be on the safe side and rule out possible DVT.  Patient does not want to obtain that and would rather just monitor her symptoms since her exam is mostly reassuring today.  Reviewed supportive care techniques with her to do at home including taking low doses of ibuprofen, using heat source, exercise, stretches and elevation as well as her compression hose.  Reviewed return and ED precautions. ? ?Final Clinical Impressions(s) / UC Diagnoses  ? ?Final diagnoses:  ?Varicose veins of leg with pain, right  ? ? ? ?Discharge Instructions   ? ?  ?-We discussed getting an ultrasound to assess for possible DVT/blood clot.  You want to hold off for now and just monitor ?- Continue to wear your compression hose and keep feet elevated as much as possible.  Stretching exercise.  Walking would be good. ?- You can try heat and the ibuprofen as well. ?- If you notice increased swelling in the size of your right lower leg compared to your left, associated increased warmth or redness or worsening pain, fever, pain in your chest or breathing difficulty, you should be seen again immediately and reevaluated. ?- If your pain is not improving over the next several days you should also consider reevaluation. ? ? ? ? ?ED Prescriptions   ?None ?  ? ?PDMP not reviewed this encounter. ?  ?Danton Clap, PA-C ?11/11/21 1321 ? ?

## 2021-11-11 NOTE — Discharge Instructions (Signed)
-  We discussed getting an ultrasound to assess for possible DVT/blood clot.  You want to hold off for now and just monitor ?- Continue to wear your compression hose and keep feet elevated as much as possible.  Stretching exercise.  Walking would be good. ?- You can try heat and the ibuprofen as well. ?- If you notice increased swelling in the size of your right lower leg compared to your left, associated increased warmth or redness or worsening pain, fever, pain in your chest or breathing difficulty, you should be seen again immediately and reevaluated. ?- If your pain is not improving over the next several days you should also consider reevaluation. ?

## 2021-11-11 NOTE — ED Triage Notes (Signed)
Pt presents with right leg pain. States has been having varicose veins in the legs for awhile but it has never caused pain. States it is difficult and painful to put on compression hose.  ?

## 2021-11-16 DIAGNOSIS — M316 Other giant cell arteritis: Secondary | ICD-10-CM | POA: Diagnosis not present

## 2021-12-14 DIAGNOSIS — M316 Other giant cell arteritis: Secondary | ICD-10-CM | POA: Diagnosis not present

## 2021-12-28 DIAGNOSIS — H2513 Age-related nuclear cataract, bilateral: Secondary | ICD-10-CM | POA: Diagnosis not present

## 2022-01-13 DIAGNOSIS — Z6833 Body mass index (BMI) 33.0-33.9, adult: Secondary | ICD-10-CM | POA: Diagnosis not present

## 2022-01-13 DIAGNOSIS — C50411 Malignant neoplasm of upper-outer quadrant of right female breast: Secondary | ICD-10-CM | POA: Diagnosis not present

## 2022-01-13 DIAGNOSIS — C50911 Malignant neoplasm of unspecified site of right female breast: Secondary | ICD-10-CM | POA: Diagnosis not present

## 2022-01-13 DIAGNOSIS — Z170 Estrogen receptor positive status [ER+]: Secondary | ICD-10-CM | POA: Diagnosis not present

## 2022-01-17 DIAGNOSIS — M316 Other giant cell arteritis: Secondary | ICD-10-CM | POA: Diagnosis not present

## 2022-02-15 DIAGNOSIS — Z7952 Long term (current) use of systemic steroids: Secondary | ICD-10-CM | POA: Diagnosis not present

## 2022-02-15 DIAGNOSIS — Z7960 Long term (current) use of unspecified immunomodulators and immunosuppressants: Secondary | ICD-10-CM | POA: Diagnosis not present

## 2022-02-15 DIAGNOSIS — M316 Other giant cell arteritis: Secondary | ICD-10-CM | POA: Diagnosis not present

## 2022-03-17 DIAGNOSIS — M316 Other giant cell arteritis: Secondary | ICD-10-CM | POA: Diagnosis not present

## 2022-04-06 DIAGNOSIS — Z1211 Encounter for screening for malignant neoplasm of colon: Secondary | ICD-10-CM | POA: Diagnosis not present

## 2022-04-06 DIAGNOSIS — I7300 Raynaud's syndrome without gangrene: Secondary | ICD-10-CM | POA: Diagnosis not present

## 2022-04-06 DIAGNOSIS — Z0000 Encounter for general adult medical examination without abnormal findings: Secondary | ICD-10-CM | POA: Diagnosis not present

## 2022-04-06 DIAGNOSIS — Z13220 Encounter for screening for lipoid disorders: Secondary | ICD-10-CM | POA: Diagnosis not present

## 2022-04-06 DIAGNOSIS — Z1329 Encounter for screening for other suspected endocrine disorder: Secondary | ICD-10-CM | POA: Diagnosis not present

## 2022-04-06 DIAGNOSIS — Z6834 Body mass index (BMI) 34.0-34.9, adult: Secondary | ICD-10-CM | POA: Diagnosis not present

## 2022-04-06 DIAGNOSIS — E6609 Other obesity due to excess calories: Secondary | ICD-10-CM | POA: Diagnosis not present

## 2022-04-06 DIAGNOSIS — Z170 Estrogen receptor positive status [ER+]: Secondary | ICD-10-CM | POA: Diagnosis not present

## 2022-04-06 DIAGNOSIS — C50411 Malignant neoplasm of upper-outer quadrant of right female breast: Secondary | ICD-10-CM | POA: Diagnosis not present

## 2022-04-06 DIAGNOSIS — M316 Other giant cell arteritis: Secondary | ICD-10-CM | POA: Diagnosis not present

## 2022-04-06 DIAGNOSIS — Z1159 Encounter for screening for other viral diseases: Secondary | ICD-10-CM | POA: Diagnosis not present

## 2022-04-06 DIAGNOSIS — M8580 Other specified disorders of bone density and structure, unspecified site: Secondary | ICD-10-CM | POA: Diagnosis not present

## 2022-04-06 DIAGNOSIS — M353 Polymyalgia rheumatica: Secondary | ICD-10-CM | POA: Diagnosis not present

## 2022-04-11 DIAGNOSIS — Z8589 Personal history of malignant neoplasm of other organs and systems: Secondary | ICD-10-CM | POA: Diagnosis not present

## 2022-04-11 DIAGNOSIS — C50611 Malignant neoplasm of axillary tail of right female breast: Secondary | ICD-10-CM | POA: Diagnosis not present

## 2022-04-19 DIAGNOSIS — M316 Other giant cell arteritis: Secondary | ICD-10-CM | POA: Diagnosis not present

## 2022-04-25 DIAGNOSIS — C50411 Malignant neoplasm of upper-outer quadrant of right female breast: Secondary | ICD-10-CM | POA: Diagnosis not present

## 2022-04-25 DIAGNOSIS — Z170 Estrogen receptor positive status [ER+]: Secondary | ICD-10-CM | POA: Diagnosis not present

## 2022-04-25 DIAGNOSIS — Z6834 Body mass index (BMI) 34.0-34.9, adult: Secondary | ICD-10-CM | POA: Diagnosis not present

## 2022-05-17 DIAGNOSIS — M316 Other giant cell arteritis: Secondary | ICD-10-CM | POA: Diagnosis not present

## 2022-06-14 DIAGNOSIS — M316 Other giant cell arteritis: Secondary | ICD-10-CM | POA: Diagnosis not present

## 2022-06-17 DIAGNOSIS — Z7960 Long term (current) use of unspecified immunomodulators and immunosuppressants: Secondary | ICD-10-CM | POA: Diagnosis not present

## 2022-06-17 DIAGNOSIS — Z7952 Long term (current) use of systemic steroids: Secondary | ICD-10-CM | POA: Diagnosis not present

## 2022-06-17 DIAGNOSIS — M316 Other giant cell arteritis: Secondary | ICD-10-CM | POA: Diagnosis not present

## 2022-07-18 DIAGNOSIS — M316 Other giant cell arteritis: Secondary | ICD-10-CM | POA: Diagnosis not present

## 2022-08-15 DIAGNOSIS — M316 Other giant cell arteritis: Secondary | ICD-10-CM | POA: Diagnosis not present

## 2022-09-13 DIAGNOSIS — M316 Other giant cell arteritis: Secondary | ICD-10-CM | POA: Diagnosis not present

## 2022-10-11 DIAGNOSIS — M316 Other giant cell arteritis: Secondary | ICD-10-CM | POA: Diagnosis not present

## 2022-10-19 DIAGNOSIS — M316 Other giant cell arteritis: Secondary | ICD-10-CM | POA: Diagnosis not present

## 2022-10-19 DIAGNOSIS — Z7952 Long term (current) use of systemic steroids: Secondary | ICD-10-CM | POA: Diagnosis not present

## 2022-10-19 DIAGNOSIS — Z7960 Long term (current) use of unspecified immunomodulators and immunosuppressants: Secondary | ICD-10-CM | POA: Diagnosis not present

## 2022-11-08 DIAGNOSIS — M316 Other giant cell arteritis: Secondary | ICD-10-CM | POA: Diagnosis not present

## 2022-12-06 DIAGNOSIS — M316 Other giant cell arteritis: Secondary | ICD-10-CM | POA: Diagnosis not present

## 2023-01-02 DIAGNOSIS — Z7952 Long term (current) use of systemic steroids: Secondary | ICD-10-CM | POA: Diagnosis not present

## 2023-01-02 DIAGNOSIS — M316 Other giant cell arteritis: Secondary | ICD-10-CM | POA: Diagnosis not present

## 2023-01-02 DIAGNOSIS — Z7960 Long term (current) use of unspecified immunomodulators and immunosuppressants: Secondary | ICD-10-CM | POA: Diagnosis not present

## 2023-02-19 DIAGNOSIS — L03116 Cellulitis of left lower limb: Secondary | ICD-10-CM | POA: Diagnosis not present

## 2023-02-23 DIAGNOSIS — M79662 Pain in left lower leg: Secondary | ICD-10-CM | POA: Diagnosis not present

## 2023-02-23 DIAGNOSIS — Z882 Allergy status to sulfonamides status: Secondary | ICD-10-CM | POA: Diagnosis not present

## 2023-02-23 DIAGNOSIS — E669 Obesity, unspecified: Secondary | ICD-10-CM | POA: Diagnosis not present

## 2023-02-23 DIAGNOSIS — Z885 Allergy status to narcotic agent status: Secondary | ICD-10-CM | POA: Diagnosis not present

## 2023-02-23 DIAGNOSIS — Z880 Allergy status to penicillin: Secondary | ICD-10-CM | POA: Diagnosis not present

## 2023-02-23 DIAGNOSIS — L03116 Cellulitis of left lower limb: Secondary | ICD-10-CM | POA: Diagnosis not present

## 2023-02-23 DIAGNOSIS — Z79811 Long term (current) use of aromatase inhibitors: Secondary | ICD-10-CM | POA: Diagnosis not present

## 2023-02-23 DIAGNOSIS — Z79899 Other long term (current) drug therapy: Secondary | ICD-10-CM | POA: Diagnosis not present

## 2023-02-23 DIAGNOSIS — Z881 Allergy status to other antibiotic agents status: Secondary | ICD-10-CM | POA: Diagnosis not present

## 2023-02-23 DIAGNOSIS — Z886 Allergy status to analgesic agent status: Secondary | ICD-10-CM | POA: Diagnosis not present

## 2023-02-23 DIAGNOSIS — Z792 Long term (current) use of antibiotics: Secondary | ICD-10-CM | POA: Diagnosis not present

## 2023-02-23 DIAGNOSIS — E785 Hyperlipidemia, unspecified: Secondary | ICD-10-CM | POA: Diagnosis not present

## 2023-03-07 DIAGNOSIS — I998 Other disorder of circulatory system: Secondary | ICD-10-CM | POA: Diagnosis not present

## 2023-03-07 DIAGNOSIS — I878 Other specified disorders of veins: Secondary | ICD-10-CM | POA: Diagnosis not present

## 2023-03-07 DIAGNOSIS — Z882 Allergy status to sulfonamides status: Secondary | ICD-10-CM | POA: Diagnosis not present

## 2023-03-07 DIAGNOSIS — Z923 Personal history of irradiation: Secondary | ICD-10-CM | POA: Diagnosis not present

## 2023-03-07 DIAGNOSIS — Z886 Allergy status to analgesic agent status: Secondary | ICD-10-CM | POA: Diagnosis not present

## 2023-03-07 DIAGNOSIS — Z888 Allergy status to other drugs, medicaments and biological substances status: Secondary | ICD-10-CM | POA: Diagnosis not present

## 2023-03-07 DIAGNOSIS — M79662 Pain in left lower leg: Secondary | ICD-10-CM | POA: Diagnosis not present

## 2023-03-07 DIAGNOSIS — I8392 Asymptomatic varicose veins of left lower extremity: Secondary | ICD-10-CM | POA: Diagnosis not present

## 2023-03-07 DIAGNOSIS — Z9011 Acquired absence of right breast and nipple: Secondary | ICD-10-CM | POA: Diagnosis not present

## 2023-03-07 DIAGNOSIS — Z881 Allergy status to other antibiotic agents status: Secondary | ICD-10-CM | POA: Diagnosis not present

## 2023-03-07 DIAGNOSIS — Z79811 Long term (current) use of aromatase inhibitors: Secondary | ICD-10-CM | POA: Diagnosis not present

## 2023-03-07 DIAGNOSIS — I83892 Varicose veins of left lower extremities with other complications: Secondary | ICD-10-CM | POA: Diagnosis not present

## 2023-03-07 DIAGNOSIS — R238 Other skin changes: Secondary | ICD-10-CM | POA: Diagnosis not present

## 2023-03-07 DIAGNOSIS — Z853 Personal history of malignant neoplasm of breast: Secondary | ICD-10-CM | POA: Diagnosis not present

## 2023-03-07 DIAGNOSIS — Z885 Allergy status to narcotic agent status: Secondary | ICD-10-CM | POA: Diagnosis not present

## 2023-03-07 DIAGNOSIS — Z880 Allergy status to penicillin: Secondary | ICD-10-CM | POA: Diagnosis not present

## 2023-03-10 DIAGNOSIS — H2513 Age-related nuclear cataract, bilateral: Secondary | ICD-10-CM | POA: Diagnosis not present

## 2023-03-10 DIAGNOSIS — Z9889 Other specified postprocedural states: Secondary | ICD-10-CM | POA: Diagnosis not present

## 2023-03-10 DIAGNOSIS — M316 Other giant cell arteritis: Secondary | ICD-10-CM | POA: Diagnosis not present

## 2023-03-16 DIAGNOSIS — M79605 Pain in left leg: Secondary | ICD-10-CM | POA: Diagnosis not present

## 2023-03-16 DIAGNOSIS — Z886 Allergy status to analgesic agent status: Secondary | ICD-10-CM | POA: Diagnosis not present

## 2023-03-16 DIAGNOSIS — M79662 Pain in left lower leg: Secondary | ICD-10-CM | POA: Diagnosis not present

## 2023-03-16 DIAGNOSIS — Z888 Allergy status to other drugs, medicaments and biological substances status: Secondary | ICD-10-CM | POA: Diagnosis not present

## 2023-03-16 DIAGNOSIS — Z881 Allergy status to other antibiotic agents status: Secondary | ICD-10-CM | POA: Diagnosis not present

## 2023-03-16 DIAGNOSIS — E785 Hyperlipidemia, unspecified: Secondary | ICD-10-CM | POA: Diagnosis not present

## 2023-03-16 DIAGNOSIS — C50911 Malignant neoplasm of unspecified site of right female breast: Secondary | ICD-10-CM | POA: Diagnosis not present

## 2023-03-16 DIAGNOSIS — Z885 Allergy status to narcotic agent status: Secondary | ICD-10-CM | POA: Diagnosis not present

## 2023-03-16 DIAGNOSIS — Z882 Allergy status to sulfonamides status: Secondary | ICD-10-CM | POA: Diagnosis not present

## 2023-03-16 DIAGNOSIS — Z79811 Long term (current) use of aromatase inhibitors: Secondary | ICD-10-CM | POA: Diagnosis not present

## 2023-03-16 DIAGNOSIS — Z880 Allergy status to penicillin: Secondary | ICD-10-CM | POA: Diagnosis not present

## 2023-03-16 DIAGNOSIS — Z170 Estrogen receptor positive status [ER+]: Secondary | ICD-10-CM | POA: Diagnosis not present

## 2023-03-16 DIAGNOSIS — M7989 Other specified soft tissue disorders: Secondary | ICD-10-CM | POA: Diagnosis not present

## 2023-03-16 DIAGNOSIS — I872 Venous insufficiency (chronic) (peripheral): Secondary | ICD-10-CM | POA: Diagnosis not present

## 2023-03-22 DIAGNOSIS — L03116 Cellulitis of left lower limb: Secondary | ICD-10-CM | POA: Diagnosis not present

## 2023-03-22 DIAGNOSIS — I872 Venous insufficiency (chronic) (peripheral): Secondary | ICD-10-CM | POA: Diagnosis not present

## 2023-03-22 DIAGNOSIS — I83893 Varicose veins of bilateral lower extremities with other complications: Secondary | ICD-10-CM | POA: Diagnosis not present

## 2023-03-22 DIAGNOSIS — I878 Other specified disorders of veins: Secondary | ICD-10-CM | POA: Diagnosis not present

## 2023-03-22 DIAGNOSIS — I8390 Asymptomatic varicose veins of unspecified lower extremity: Secondary | ICD-10-CM | POA: Diagnosis not present

## 2023-04-17 DIAGNOSIS — L03116 Cellulitis of left lower limb: Secondary | ICD-10-CM | POA: Diagnosis not present

## 2023-04-17 DIAGNOSIS — I8390 Asymptomatic varicose veins of unspecified lower extremity: Secondary | ICD-10-CM | POA: Diagnosis not present

## 2023-04-27 DIAGNOSIS — M316 Other giant cell arteritis: Secondary | ICD-10-CM | POA: Diagnosis not present

## 2023-04-27 DIAGNOSIS — Z7952 Long term (current) use of systemic steroids: Secondary | ICD-10-CM | POA: Diagnosis not present

## 2023-05-08 DIAGNOSIS — I83892 Varicose veins of left lower extremities with other complications: Secondary | ICD-10-CM | POA: Diagnosis not present

## 2023-05-08 DIAGNOSIS — L03116 Cellulitis of left lower limb: Secondary | ICD-10-CM | POA: Diagnosis not present

## 2023-05-08 DIAGNOSIS — I878 Other specified disorders of veins: Secondary | ICD-10-CM | POA: Diagnosis not present

## 2023-05-08 DIAGNOSIS — I872 Venous insufficiency (chronic) (peripheral): Secondary | ICD-10-CM | POA: Diagnosis not present

## 2023-05-08 DIAGNOSIS — I82812 Embolism and thrombosis of superficial veins of left lower extremities: Secondary | ICD-10-CM | POA: Diagnosis not present

## 2023-05-08 DIAGNOSIS — M353 Polymyalgia rheumatica: Secondary | ICD-10-CM | POA: Diagnosis not present

## 2023-05-08 DIAGNOSIS — M316 Other giant cell arteritis: Secondary | ICD-10-CM | POA: Diagnosis not present

## 2023-05-08 DIAGNOSIS — I7300 Raynaud's syndrome without gangrene: Secondary | ICD-10-CM | POA: Diagnosis not present

## 2023-05-08 DIAGNOSIS — E785 Hyperlipidemia, unspecified: Secondary | ICD-10-CM | POA: Diagnosis not present

## 2023-05-09 DIAGNOSIS — D1724 Benign lipomatous neoplasm of skin and subcutaneous tissue of left leg: Secondary | ICD-10-CM | POA: Diagnosis not present

## 2023-05-09 DIAGNOSIS — R600 Localized edema: Secondary | ICD-10-CM | POA: Diagnosis not present

## 2023-05-09 DIAGNOSIS — I8393 Asymptomatic varicose veins of bilateral lower extremities: Secondary | ICD-10-CM | POA: Diagnosis not present

## 2023-05-09 DIAGNOSIS — R936 Abnormal findings on diagnostic imaging of limbs: Secondary | ICD-10-CM | POA: Diagnosis not present

## 2023-05-09 DIAGNOSIS — L03116 Cellulitis of left lower limb: Secondary | ICD-10-CM | POA: Diagnosis not present

## 2023-05-09 DIAGNOSIS — I878 Other specified disorders of veins: Secondary | ICD-10-CM | POA: Diagnosis not present

## 2023-05-10 DIAGNOSIS — Z170 Estrogen receptor positive status [ER+]: Secondary | ICD-10-CM | POA: Diagnosis not present

## 2023-05-10 DIAGNOSIS — C50411 Malignant neoplasm of upper-outer quadrant of right female breast: Secondary | ICD-10-CM | POA: Diagnosis not present

## 2023-05-10 DIAGNOSIS — Z1231 Encounter for screening mammogram for malignant neoplasm of breast: Secondary | ICD-10-CM | POA: Diagnosis not present

## 2023-05-11 DIAGNOSIS — Z13820 Encounter for screening for osteoporosis: Secondary | ICD-10-CM | POA: Diagnosis not present

## 2023-05-11 DIAGNOSIS — M8588 Other specified disorders of bone density and structure, other site: Secondary | ICD-10-CM | POA: Diagnosis not present

## 2023-05-11 DIAGNOSIS — Z7952 Long term (current) use of systemic steroids: Secondary | ICD-10-CM | POA: Diagnosis not present

## 2023-05-11 DIAGNOSIS — C50411 Malignant neoplasm of upper-outer quadrant of right female breast: Secondary | ICD-10-CM | POA: Diagnosis not present

## 2023-05-11 DIAGNOSIS — Z170 Estrogen receptor positive status [ER+]: Secondary | ICD-10-CM | POA: Diagnosis not present

## 2023-05-11 DIAGNOSIS — Z1231 Encounter for screening mammogram for malignant neoplasm of breast: Secondary | ICD-10-CM | POA: Diagnosis not present

## 2023-05-22 DIAGNOSIS — L03116 Cellulitis of left lower limb: Secondary | ICD-10-CM | POA: Diagnosis not present

## 2023-05-22 DIAGNOSIS — I878 Other specified disorders of veins: Secondary | ICD-10-CM | POA: Diagnosis not present

## 2023-05-29 DIAGNOSIS — M316 Other giant cell arteritis: Secondary | ICD-10-CM | POA: Diagnosis not present

## 2023-05-29 DIAGNOSIS — Z7952 Long term (current) use of systemic steroids: Secondary | ICD-10-CM | POA: Diagnosis not present

## 2023-06-12 DIAGNOSIS — D179 Benign lipomatous neoplasm, unspecified: Secondary | ICD-10-CM | POA: Diagnosis not present

## 2023-06-21 DIAGNOSIS — M8589 Other specified disorders of bone density and structure, multiple sites: Secondary | ICD-10-CM | POA: Diagnosis not present

## 2023-06-21 DIAGNOSIS — Z7952 Long term (current) use of systemic steroids: Secondary | ICD-10-CM | POA: Diagnosis not present

## 2023-06-21 DIAGNOSIS — M8588 Other specified disorders of bone density and structure, other site: Secondary | ICD-10-CM | POA: Diagnosis not present

## 2023-07-03 DIAGNOSIS — L03116 Cellulitis of left lower limb: Secondary | ICD-10-CM | POA: Diagnosis not present
